# Patient Record
Sex: Female | Born: 1945 | Race: White | Hispanic: No | Marital: Married | Smoking: Former smoker
Health system: Southern US, Community
[De-identification: ages and names within clinical notes are randomized; demographics above are authoritative.]

## PROBLEM LIST (undated history)

## (undated) DIAGNOSIS — F419 Anxiety disorder, unspecified: Secondary | ICD-10-CM

## (undated) DIAGNOSIS — M1990 Unspecified osteoarthritis, unspecified site: Secondary | ICD-10-CM

## (undated) DIAGNOSIS — T7840XA Allergy, unspecified, initial encounter: Secondary | ICD-10-CM

## (undated) DIAGNOSIS — Z780 Asymptomatic menopausal state: Secondary | ICD-10-CM

## (undated) DIAGNOSIS — E785 Hyperlipidemia, unspecified: Secondary | ICD-10-CM

## (undated) DIAGNOSIS — I10 Essential (primary) hypertension: Secondary | ICD-10-CM

## (undated) DIAGNOSIS — G4700 Insomnia, unspecified: Secondary | ICD-10-CM

## (undated) DIAGNOSIS — I8390 Asymptomatic varicose veins of unspecified lower extremity: Secondary | ICD-10-CM

## (undated) DIAGNOSIS — F4310 Post-traumatic stress disorder, unspecified: Secondary | ICD-10-CM

## (undated) DIAGNOSIS — K219 Gastro-esophageal reflux disease without esophagitis: Secondary | ICD-10-CM

## (undated) DIAGNOSIS — M810 Age-related osteoporosis without current pathological fracture: Secondary | ICD-10-CM

## (undated) DIAGNOSIS — N952 Postmenopausal atrophic vaginitis: Secondary | ICD-10-CM

## (undated) DIAGNOSIS — M26629 Arthralgia of temporomandibular joint, unspecified side: Secondary | ICD-10-CM

## (undated) DIAGNOSIS — M8580 Other specified disorders of bone density and structure, unspecified site: Secondary | ICD-10-CM

## (undated) DIAGNOSIS — N393 Stress incontinence (female) (male): Secondary | ICD-10-CM

## (undated) DIAGNOSIS — N939 Abnormal uterine and vaginal bleeding, unspecified: Secondary | ICD-10-CM

## (undated) HISTORY — DX: Post-traumatic stress disorder, unspecified: F43.10

## (undated) HISTORY — DX: Hyperlipidemia, unspecified: E78.5

## (undated) HISTORY — DX: Anxiety disorder, unspecified: F41.9

## (undated) HISTORY — DX: Stress incontinence (female) (male): N39.3

## (undated) HISTORY — DX: Age-related osteoporosis without current pathological fracture: M81.0

## (undated) HISTORY — PX: DILATION AND CURETTAGE OF UTERUS: SHX78

## (undated) HISTORY — DX: Postmenopausal atrophic vaginitis: N95.2

## (undated) HISTORY — DX: Arthralgia of temporomandibular joint, unspecified side: M26.629

## (undated) HISTORY — DX: Asymptomatic menopausal state: Z78.0

## (undated) HISTORY — DX: Other specified disorders of bone density and structure, unspecified site: M85.80

## (undated) HISTORY — PX: OTHER SURGICAL HISTORY: SHX169

## (undated) HISTORY — DX: Essential (primary) hypertension: I10

## (undated) HISTORY — DX: Gastro-esophageal reflux disease without esophagitis: K21.9

## (undated) HISTORY — DX: Allergy, unspecified, initial encounter: T78.40XA

## (undated) HISTORY — DX: Insomnia, unspecified: G47.00

---

## 2001-03-05 ENCOUNTER — Encounter: Admission: RE | Admit: 2001-03-05 | Discharge: 2001-03-05 | Payer: Self-pay | Admitting: Orthopedic Surgery

## 2001-03-05 ENCOUNTER — Encounter: Payer: Self-pay | Admitting: Orthopedic Surgery

## 2013-01-11 ENCOUNTER — Ambulatory Visit: Payer: Self-pay | Admitting: Otolaryngology

## 2014-03-07 ENCOUNTER — Ambulatory Visit: Payer: Self-pay | Admitting: Podiatry

## 2014-04-11 ENCOUNTER — Ambulatory Visit: Payer: Self-pay | Admitting: Podiatry

## 2014-05-25 ENCOUNTER — Encounter: Payer: Self-pay | Admitting: Internal Medicine

## 2015-04-20 ENCOUNTER — Encounter: Payer: Self-pay | Admitting: Internal Medicine

## 2015-04-27 ENCOUNTER — Telehealth: Payer: Self-pay | Admitting: Cardiovascular Disease

## 2015-04-27 NOTE — Telephone Encounter (Signed)
Did not need this encounter °

## 2015-06-12 ENCOUNTER — Encounter: Payer: Self-pay | Admitting: Family Medicine

## 2015-07-10 DIAGNOSIS — M810 Age-related osteoporosis without current pathological fracture: Secondary | ICD-10-CM

## 2015-07-10 DIAGNOSIS — F419 Anxiety disorder, unspecified: Secondary | ICD-10-CM | POA: Insufficient documentation

## 2015-07-10 DIAGNOSIS — M26629 Arthralgia of temporomandibular joint, unspecified side: Secondary | ICD-10-CM | POA: Insufficient documentation

## 2015-07-10 DIAGNOSIS — I10 Essential (primary) hypertension: Secondary | ICD-10-CM

## 2015-07-10 DIAGNOSIS — J309 Allergic rhinitis, unspecified: Secondary | ICD-10-CM | POA: Insufficient documentation

## 2015-07-10 DIAGNOSIS — E785 Hyperlipidemia, unspecified: Secondary | ICD-10-CM | POA: Insufficient documentation

## 2015-07-10 DIAGNOSIS — K219 Gastro-esophageal reflux disease without esophagitis: Secondary | ICD-10-CM | POA: Insufficient documentation

## 2015-07-10 DIAGNOSIS — M8580 Other specified disorders of bone density and structure, unspecified site: Secondary | ICD-10-CM

## 2015-07-10 DIAGNOSIS — Z780 Asymptomatic menopausal state: Secondary | ICD-10-CM | POA: Insufficient documentation

## 2015-07-10 DIAGNOSIS — N393 Stress incontinence (female) (male): Secondary | ICD-10-CM

## 2015-07-10 DIAGNOSIS — G4700 Insomnia, unspecified: Secondary | ICD-10-CM | POA: Insufficient documentation

## 2015-07-10 DIAGNOSIS — N952 Postmenopausal atrophic vaginitis: Secondary | ICD-10-CM

## 2015-07-17 ENCOUNTER — Ambulatory Visit (INDEPENDENT_AMBULATORY_CARE_PROVIDER_SITE_OTHER): Payer: Medicare Other | Admitting: Unknown Physician Specialty

## 2015-07-17 ENCOUNTER — Encounter: Payer: Self-pay | Admitting: Unknown Physician Specialty

## 2015-07-17 VITALS — BP 118/70 | HR 63 | Temp 98.3°F | Ht 66.0 in | Wt 149.8 lb

## 2015-07-17 DIAGNOSIS — F419 Anxiety disorder, unspecified: Secondary | ICD-10-CM | POA: Diagnosis not present

## 2015-07-17 DIAGNOSIS — F4312 Post-traumatic stress disorder, chronic: Secondary | ICD-10-CM | POA: Insufficient documentation

## 2015-07-17 MED ORDER — ALPRAZOLAM 0.5 MG PO TABS
0.5000 mg | ORAL_TABLET | Freq: Every evening | ORAL | Status: DC | PRN
Start: 2015-07-17 — End: 2016-01-29

## 2015-07-17 NOTE — Assessment & Plan Note (Signed)
Pt is maintained on present dose of alprazolam.  Rx given for 6 months

## 2015-07-17 NOTE — Progress Notes (Signed)
   BP 118/70 mmHg  Pulse 63  Temp(Src) 98.3 F (36.8 C)  Ht 5\' 6"  (1.676 m)  Wt 149 lb 12.8 oz (67.949 kg)  BMI 24.19 kg/m2  SpO2 100%  LMP  (LMP Unknown)   Subjective:    Patient ID: Hailey Wall, female    DOB: 1946/02/09, 69 y.o.   MRN: [id number]  HPI: Hailey Wall is a 69 y.o. female  Chief Complaint  Patient presents with  . Medication Refill   "I am here for my yearly Alprazolam" review.  Pt states she takes a 1/2 at night before she goes to bed.  States she is trying to take less at night as is cutting off some corners.  She is having procedures done for her leg veins and "it tears my nerves up."  States it helps her rest and get about 6-7 hours of sleep.    Relevant past medical, surgical, family and social history reviewed and updated as indicated. Interim medical history since our last visit reviewed. Allergies and medications reviewed and updated.  Review of Systems  Constitutional: Negative.   HENT: Negative.   Eyes: Negative.   Respiratory: Negative.   Cardiovascular: Negative.   Gastrointestinal: Negative.   Endocrine: Negative.   Genitourinary: Negative.        Taking Vagifem  Musculoskeletal: Negative.   Skin: Negative.   Allergic/Immunologic:       Getting chiropractic care which is helping allergies.    Neurological: Negative.   Hematological: Negative.   Psychiatric/Behavioral: Negative.     Per HPI unless specifically indicated above     Objective:    BP 118/70 mmHg  Pulse 63  Temp(Src) 98.3 F (36.8 C)  Ht 5\' 6"  (1.676 m)  Wt 149 lb 12.8 oz (67.949 kg)  BMI 24.19 kg/m2  SpO2 100%  LMP  (LMP Unknown)  Wt Readings from Last 3 Encounters:  07/17/15 149 lb 12.8 oz (67.949 kg)  03/20/15 151 lb (68.493 kg)    Physical Exam  Constitutional: She is oriented to person, place, and time. She appears well-developed and well-nourished. No distress.  HENT:  Head: Normocephalic and atraumatic.  Eyes: Conjunctivae and lids are normal. Right  eye exhibits no discharge. Left eye exhibits no discharge. No scleral icterus.  Cardiovascular: Normal rate, regular rhythm and normal heart sounds.   Pulmonary/Chest: Effort normal and breath sounds normal. No respiratory distress.  Abdominal: Normal appearance. There is no splenomegaly or hepatomegaly.  Musculoskeletal: Normal range of motion.  Neurological: She is alert and oriented to person, place, and time.  Skin: Skin is intact.  Psychiatric: She has a normal mood and affect. Her behavior is normal. Judgment and thought content normal.    No results found for this or any previous visit.    Assessment & Plan:   Problem List Items Addressed This Visit      Other   Chronic anxiety - Primary    Pt is maintained on present dose of alprazolam.  Rx given for 6 months      Relevant Medications   ALPRAZolam (XANAX) 0.5 MG tablet       Follow up plan: Return for Needs recheck for PE.

## 2015-11-08 ENCOUNTER — Other Ambulatory Visit: Payer: Self-pay

## 2015-11-08 MED ORDER — ESTRADIOL 10 MCG VA TABS: 10.0000 ug | ORAL_TABLET | VAGINAL | Status: DC

## 2015-11-08 NOTE — Telephone Encounter (Signed)
We got a refill request for vagifem vaginal tablets but according to practice partner, they were last prescribed in August of 2015. The rx request states that the rx was last filled on 10/09/15. Patient was last seen with [place] 07/17/15 and practice partner number [place] [id number]. Does she need to be seen to refill this?

## 2015-11-13 ENCOUNTER — Ambulatory Visit: Payer: Medicare Other

## 2015-11-13 ENCOUNTER — Encounter: Payer: Medicare Other | Admitting: Unknown Physician Specialty

## 2015-11-13 DIAGNOSIS — Z23 Encounter for immunization: Secondary | ICD-10-CM

## 2016-01-02 ENCOUNTER — Encounter: Payer: Medicare Other | Admitting: Unknown Physician Specialty

## 2016-01-08 ENCOUNTER — Encounter: Payer: Medicare Other | Admitting: Unknown Physician Specialty

## 2016-01-10 DIAGNOSIS — J301 Allergic rhinitis due to pollen: Secondary | ICD-10-CM | POA: Diagnosis not present

## 2016-01-24 DIAGNOSIS — J301 Allergic rhinitis due to pollen: Secondary | ICD-10-CM | POA: Diagnosis not present

## 2016-01-29 ENCOUNTER — Other Ambulatory Visit: Payer: Self-pay | Admitting: Unknown Physician Specialty

## 2016-01-29 ENCOUNTER — Encounter: Payer: Self-pay | Admitting: Unknown Physician Specialty

## 2016-01-29 ENCOUNTER — Ambulatory Visit (INDEPENDENT_AMBULATORY_CARE_PROVIDER_SITE_OTHER): Payer: PPO | Admitting: Unknown Physician Specialty

## 2016-01-29 VITALS — BP 134/76 | HR 71 | Temp 98.1°F | Ht 65.7 in | Wt 149.2 lb

## 2016-01-29 DIAGNOSIS — F419 Anxiety disorder, unspecified: Secondary | ICD-10-CM | POA: Diagnosis not present

## 2016-01-29 DIAGNOSIS — J0140 Acute pansinusitis, unspecified: Secondary | ICD-10-CM | POA: Diagnosis not present

## 2016-01-29 MED ORDER — ALPRAZOLAM 0.5 MG PO TABS: 0.5000 mg | ORAL_TABLET | Freq: Every evening | ORAL | Status: DC | PRN

## 2016-01-29 MED ORDER — LEVOFLOXACIN 500 MG PO TABS: 500.0000 mg | ORAL_TABLET | Freq: Every day | ORAL | Status: DC

## 2016-01-29 NOTE — Progress Notes (Signed)
BP 134/76 mmHg  Pulse 71  Temp(Src) 98.1 F (36.7 C)  Ht 5' 5.7" (1.669 m)  Wt 149 lb 3.2 oz (67.677 kg)  BMI 24.30 kg/m2  SpO2 97%  LMP  (LMP Unknown)   Subjective:    Patient ID: Hailey Wall, female    DOB: [birth year], 70 y.o.   MRN: [id number]  HPI: Hailey Wall is a 69 y.o. female  Chief Complaint  Patient presents with  . Sinusitis    pt states she is having sinus pressure, headache, drainage, and some cough (productive). States symptoms started to get worse a few weeks ago.   Sinusitis This is a recurrent (Recurrent problem and sees Dr. Pryor Ochoa.  she has allergies and gets monthly shots.  ) problem. The problem has been rapidly worsening since onset. There has been no fever. Associated symptoms include congestion, headaches, sinus pressure, a sore throat and swollen glands. Pertinent negatives include no chills, coughing, diaphoresis, ear pain, hoarse voice, neck pain, shortness of breath or sneezing. Past treatments include nothing.   Anxiety/insonmnia Takes a 1/2 pill at night to "hlep me rest."  She would like a refill.  Has cut back from 1 to 1/2  Relevant past medical, surgical, family and social history reviewed and updated as indicated. Interim medical history since our last visit reviewed. Allergies and medications reviewed and updated.  Review of Systems  Constitutional: Negative for chills and diaphoresis.  HENT: Positive for congestion, sinus pressure and sore throat. Negative for ear pain, hoarse voice and sneezing.   Respiratory: Negative for cough and shortness of breath.   Musculoskeletal: Negative for neck pain.  Neurological: Positive for headaches.    Per HPI unless specifically indicated above     Objective:    BP 134/76 mmHg  Pulse 71  Temp(Src) 98.1 F (36.7 C)  Ht 5' 5.7" (1.669 m)  Wt 149 lb 3.2 oz (67.677 kg)  BMI 24.30 kg/m2  SpO2 97%  LMP  (LMP Unknown)  Wt Readings from Last 3 Encounters:  01/29/16 149 lb 3.2 oz (67.677 kg)   07/17/15 149 lb 12.8 oz (67.949 kg)  03/20/15 151 lb (68.493 kg)    Physical Exam  Constitutional: She is oriented to person, place, and time. She appears well-developed and well-nourished. No distress.  HENT:  Head: Normocephalic and atraumatic.  Right Ear: Tympanic membrane and ear canal normal.  Left Ear: Tympanic membrane and ear canal normal.  Nose: No rhinorrhea. Right sinus exhibits maxillary sinus tenderness and frontal sinus tenderness. Left sinus exhibits maxillary sinus tenderness and frontal sinus tenderness.  Eyes: Conjunctivae and lids are normal. Right eye exhibits no discharge. Left eye exhibits no discharge. No scleral icterus.  Cardiovascular: Normal rate and regular rhythm.   Pulmonary/Chest: Effort normal and breath sounds normal. No respiratory distress.  Abdominal: Normal appearance. There is no splenomegaly or hepatomegaly.  Musculoskeletal: Normal range of motion.  Neurological: She is alert and oriented to person, place, and time.  Skin: Skin is intact. No rash noted. No pallor.  Psychiatric: She has a normal mood and affect. Her behavior is normal. Judgment and thought content normal.    No results found for this or any previous visit.    Assessment & Plan:   Problem List Items Addressed This Visit      Unprioritized   Chronic anxiety    Continue Xanax at 1/2 tab/day and continue taper as possible      Relevant Medications   ALPRAZolam (XANAX) 0.5 MG tablet  Other Visit Diagnoses    Acute pansinusitis, recurrence not specified    -  Primary    Relevant Medications    levofloxacin (LEVAQUIN) 500 MG tablet        Follow up plan: Return for for physical.

## 2016-01-29 NOTE — Assessment & Plan Note (Signed)
Continue Xanax at 1/2 tab/day and continue taper as possible

## 2016-02-07 DIAGNOSIS — J301 Allergic rhinitis due to pollen: Secondary | ICD-10-CM | POA: Diagnosis not present

## 2016-02-12 DIAGNOSIS — H6980 Other specified disorders of Eustachian tube, unspecified ear: Secondary | ICD-10-CM | POA: Diagnosis not present

## 2016-02-12 DIAGNOSIS — J340 Abscess, furuncle and carbuncle of nose: Secondary | ICD-10-CM | POA: Diagnosis not present

## 2016-02-12 DIAGNOSIS — R05 Cough: Secondary | ICD-10-CM | POA: Diagnosis not present

## 2016-02-12 DIAGNOSIS — H7202 Central perforation of tympanic membrane, left ear: Secondary | ICD-10-CM | POA: Diagnosis not present

## 2016-02-12 DIAGNOSIS — H6123 Impacted cerumen, bilateral: Secondary | ICD-10-CM | POA: Diagnosis not present

## 2016-02-14 DIAGNOSIS — J301 Allergic rhinitis due to pollen: Secondary | ICD-10-CM | POA: Diagnosis not present

## 2016-02-19 ENCOUNTER — Encounter: Payer: Self-pay | Admitting: Unknown Physician Specialty

## 2016-02-19 ENCOUNTER — Ambulatory Visit (INDEPENDENT_AMBULATORY_CARE_PROVIDER_SITE_OTHER): Payer: PPO | Admitting: Unknown Physician Specialty

## 2016-02-19 VITALS — BP 128/75 | HR 79 | Temp 98.1°F | Ht 65.7 in | Wt 148.6 lb

## 2016-02-19 DIAGNOSIS — N3001 Acute cystitis with hematuria: Secondary | ICD-10-CM

## 2016-02-19 DIAGNOSIS — R319 Hematuria, unspecified: Secondary | ICD-10-CM | POA: Diagnosis not present

## 2016-02-19 MED ORDER — CIPROFLOXACIN HCL 250 MG PO TABS: 250.0000 mg | ORAL_TABLET | Freq: Two times a day (BID) | ORAL | Status: DC

## 2016-02-19 NOTE — Progress Notes (Signed)
   BP 128/75 mmHg  Pulse 79  Temp(Src) 98.1 F (36.7 C)  Ht 5' 5.7" (1.669 m)  Wt 148 lb 9.6 oz (67.405 kg)  BMI 24.20 kg/m2  SpO2 100%  LMP  (LMP Unknown)   Subjective:    Patient ID: Hailey Wall, female    DOB: 1946-07-07, 70 y.o.   MRN: [id number]  HPI: Hailey Wall is a 70 y.o. female  Chief Complaint  Patient presents with  . Urinary Tract Infection    pt states she has had some spotting when she wipes, but says she does not have any other symptoms   Urinary Tract Infection  This is a new problem. The current episode started yesterday. The problem occurs every urination. The problem has been rapidly worsening. The quality of the pain is described as burning. The pain is severe. Associated symptoms include hematuria. Pertinent negatives include no chills, discharge, flank pain, frequency, hesitancy, nausea, possible pregnancy, sweats, urgency or vomiting. She has tried nothing for the symptoms.     Relevant past medical, surgical, family and social history reviewed and updated as indicated. Interim medical history since our last visit reviewed. Allergies and medications reviewed and updated.  Review of Systems  Constitutional: Negative for chills.  Gastrointestinal: Negative for nausea and vomiting.  Genitourinary: Positive for hematuria. Negative for hesitancy, urgency, frequency and flank pain.    Per HPI unless specifically indicated above     Objective:    BP 128/75 mmHg  Pulse 79  Temp(Src) 98.1 F (36.7 C)  Ht 5' 5.7" (1.669 m)  Wt 148 lb 9.6 oz (67.405 kg)  BMI 24.20 kg/m2  SpO2 100%  LMP  (LMP Unknown)  Wt Readings from Last 3 Encounters:  02/19/16 148 lb 9.6 oz (67.405 kg)  01/29/16 149 lb 3.2 oz (67.677 kg)  07/17/15 149 lb 12.8 oz (67.949 kg)    Physical Exam  Constitutional: She is oriented to person, place, and time. She appears well-developed and well-nourished. No distress.  HENT:  Head: Normocephalic and atraumatic.  Eyes:  Conjunctivae and lids are normal. Right eye exhibits no discharge. Left eye exhibits no discharge. No scleral icterus.  Cardiovascular: Normal rate.   Pulmonary/Chest: Effort normal.  Abdominal: Normal appearance. There is no splenomegaly or hepatomegaly.  Musculoskeletal: Normal range of motion.  Neurological: She is alert and oriented to person, place, and time.  Skin: Skin is intact. No rash noted. No pallor.  Psychiatric: She has a normal mood and affect. Her behavior is normal. Judgment and thought content normal.    No results found for this or any previous visit.    Assessment & Plan:   Problem List Items Addressed This Visit    None    Visit Diagnoses    Blood in urine    -  Primary    Relevant Orders    UA/M w/rflx Culture, Routine    Acute cystitis with hematuria        Rx for Cipro BID for 7 days        Follow up plan: Return if symptoms worsen or fail to improve.

## 2016-02-20 ENCOUNTER — Encounter (HOSPITAL_COMMUNITY): Payer: Self-pay | Admitting: Emergency Medicine

## 2016-02-20 ENCOUNTER — Emergency Department (HOSPITAL_COMMUNITY)
Admission: EM | Admit: 2016-02-20 | Discharge: 2016-02-20 | Disposition: A | Discharge status: Home / Self Care | Payer: PPO | Attending: Emergency Medicine | Admitting: Emergency Medicine

## 2016-02-20 DIAGNOSIS — I10 Essential (primary) hypertension: Secondary | ICD-10-CM | POA: Diagnosis not present

## 2016-02-20 DIAGNOSIS — G4700 Insomnia, unspecified: Secondary | ICD-10-CM | POA: Insufficient documentation

## 2016-02-20 DIAGNOSIS — Z87891 Personal history of nicotine dependence: Secondary | ICD-10-CM | POA: Diagnosis not present

## 2016-02-20 DIAGNOSIS — K219 Gastro-esophageal reflux disease without esophagitis: Secondary | ICD-10-CM | POA: Insufficient documentation

## 2016-02-20 DIAGNOSIS — R319 Hematuria, unspecified: Secondary | ICD-10-CM | POA: Diagnosis not present

## 2016-02-20 DIAGNOSIS — Z8669 Personal history of other diseases of the nervous system and sense organs: Secondary | ICD-10-CM | POA: Insufficient documentation

## 2016-02-20 DIAGNOSIS — Z792 Long term (current) use of antibiotics: Secondary | ICD-10-CM | POA: Insufficient documentation

## 2016-02-20 DIAGNOSIS — N950 Postmenopausal bleeding: Secondary | ICD-10-CM | POA: Diagnosis not present

## 2016-02-20 DIAGNOSIS — Z79899 Other long term (current) drug therapy: Secondary | ICD-10-CM | POA: Diagnosis not present

## 2016-02-20 DIAGNOSIS — Z7951 Long term (current) use of inhaled steroids: Secondary | ICD-10-CM | POA: Diagnosis not present

## 2016-02-20 DIAGNOSIS — E785 Hyperlipidemia, unspecified: Secondary | ICD-10-CM | POA: Insufficient documentation

## 2016-02-20 DIAGNOSIS — F419 Anxiety disorder, unspecified: Secondary | ICD-10-CM | POA: Diagnosis not present

## 2016-02-20 LAB — URINALYSIS, ROUTINE W REFLEX MICROSCOPIC
Bilirubin Urine: NEGATIVE
Glucose, UA: NEGATIVE mg/dL
KETONES UR: 15 mg/dL — AB
LEUKOCYTES UA: NEGATIVE
NITRITE: NEGATIVE
PROTEIN: NEGATIVE mg/dL
Specific Gravity, Urine: 1.005 (ref 1.005–1.030)
pH: 6 (ref 5.0–8.0)

## 2016-02-20 LAB — URINE MICROSCOPIC-ADD ON

## 2016-02-20 NOTE — ED Notes (Signed)
Pt is in stable condition upon d/c and ambulates from ED. 

## 2016-02-20 NOTE — ED Provider Notes (Signed)
CSN: [id number]     Arrival date & time 02/20/16  [id number] History   First MD Initiated Contact with Patient 02/20/16 [phone]     Chief Complaint  Patient presents with  . Hematuria     (Consider location/radiation/quality/duration/timing/severity/associated sxs/prior Treatment) HPI Comments: Bladder prolapse Took it out/pessary Saturday Noticed a small amount of blood on pessary Sometimes has urgency to urinate, sometimes don't Started noticing spots of blood, initially thought it was vaginal Called office and couldn't bee seen until yesterday at 1, PCP Blood in urine when urination Cramping lower uterine She checked urine and said there was infection No dysuria Cramping pain since yesterday today Not sure if urine or vaginal source    Patient is a 70 y.o. female presenting with hematuria.  Hematuria Pertinent negatives include no chest pain, no abdominal pain, no headaches and no shortness of breath.    Past Medical History  Diagnosis Date  . TMJ arthralgia   . Allergy   . Osteoporosis   . Anxiety   . Menopause   . Atrophic vaginitis   . Stress incontinence   . Insomnia   . Osteopenia   . GERD (gastroesophageal reflux disease)   . Hyperlipidemia   . Hypertension    Past Surgical History  Procedure Laterality Date  . Dilation and curettage of uterus    . Two bunions removed     Family History  Problem Relation Age of Onset  . Heart disease Mother     CHF  . Mental illness Mother   . COPD Mother   . Emphysema Mother   . Scoliosis Daughter    Social History  Substance Use Topics  . Smoking status: Former Research scientist (life sciences)  . Smokeless tobacco: Never Used  . Alcohol Use: No   OB History    No data available     Review of Systems  Constitutional: Negative for fever.  HENT: Negative for sore throat.   Eyes: Negative for visual disturbance.  Respiratory: Negative for cough and shortness of breath.   Cardiovascular: Negative for chest pain.  Gastrointestinal:  Negative for abdominal pain.  Genitourinary: Positive for hematuria and vaginal bleeding (possible, not sure if vaginal or urinal). Negative for difficulty urinating.  Musculoskeletal: Negative for back pain and neck pain.  Skin: Negative for rash.  Neurological: Negative for syncope and headaches.      Allergies  Cat hair extract and Lodine  Home Medications   Prior to Admission medications   Medication Sig Start Date End Date Taking? Authorizing Provider  ALPRAZolam Duanne Moron) 0.5 MG tablet Take 1 tablet (0.5 mg total) by mouth at bedtime as needed for anxiety. 01/29/16  Yes Kathrine Haddock, NP  Calcium Citrate-Vitamin D (CALCIUM + D PO) Take 1 tablet by mouth daily.   Yes Historical Provider, MD  ciprofloxacin (CIPRO) 250 MG tablet Take 1 tablet (250 mg total) by mouth 2 (two) times daily. 02/19/16  Yes Kathrine Haddock, NP  CRANBERRY PO Take 800 mg by mouth daily.   Yes Historical Provider, MD  cyanocobalamin 100 MCG tablet Take 100 mcg by mouth daily.   Yes Historical Provider, MD  Estradiol (VAGIFEM) 10 MCG TABS vaginal tablet Place 1 tablet (10 mcg total) vaginally 2 (two) times a week. 11/08/15  Yes Kathrine Haddock, NP  fluticasone (FLOVENT HFA) 44 MCG/ACT inhaler Inhale 2 puffs into the lungs 2 (two) times daily.   Yes Historical Provider, MD  magnesium oxide (MAG-OX) 400 MG tablet Take 400 mg by mouth daily.   Yes  Historical Provider, MD  Multiple Vitamin (MULTIVITAMIN) tablet Take 1 tablet by mouth daily.   Yes Historical Provider, MD  Omega 3-6-9 Fatty Acids (OMEGA 3-6-9 COMPLEX PO) Take 1 tablet by mouth daily.   Yes Historical Provider, MD  triamcinolone (NASACORT) 55 MCG/ACT AERO nasal inhaler Place 2 sprays into the nose daily.   Yes Historical Provider, MD  TURMERIC PO Take 1,000 mg by mouth daily.   Yes Historical Provider, MD  levofloxacin (LEVAQUIN) 500 MG tablet Take 1 tablet (500 mg total) by mouth daily. 01/29/16   Kathrine Haddock, NP   BP 116/90 mmHg  Pulse 82  Temp(Src) 98.5  F (36.9 C) (Oral)  Resp 20  SpO2 98%  LMP  (LMP Unknown) Physical Exam  Constitutional: She is oriented to person, place, and time. She appears well-developed and well-nourished. No distress.  HENT:  Head: Normocephalic and atraumatic.  Eyes: Conjunctivae and EOM are normal.  Neck: Normal range of motion.  Cardiovascular: Normal rate, regular rhythm, normal heart sounds and intact distal pulses.  Exam reveals no gallop and no friction rub.   No murmur heard. Pulmonary/Chest: Effort normal and breath sounds normal. No respiratory distress. She has no wheezes. She has no rales.  Abdominal: Soft. She exhibits no distension. There is no tenderness. There is no guarding.  Genitourinary: Uterus is not deviated. Cervix exhibits no motion tenderness and no discharge. Right adnexum displays no tenderness. There is bleeding in the vagina. No foreign body around the vagina.  Musculoskeletal: She exhibits no edema or tenderness.  Neurological: She is alert and oriented to person, place, and time.  Skin: Skin is warm and dry. No rash noted. She is not diaphoretic. No erythema.  Nursing note and vitals reviewed.   ED Course  Procedures (including critical care time) Labs Review Labs Reviewed  URINALYSIS, ROUTINE W REFLEX MICROSCOPIC (NOT AT [facility]) - Abnormal; Notable for the following:    Hgb urine dipstick LARGE (*)    Ketones, ur 15 (*)    All other components within normal limits  URINE MICROSCOPIC-ADD ON - Abnormal; Notable for the following:    Squamous Epithelial / LPF 0-5 (*)    Bacteria, UA FEW (*)    All other components within normal limits    Imaging Review No results found. I have personally reviewed and evaluated these images and lab results as part of my medical decision-making.   EKG Interpretation None      MDM   Final diagnoses:  Postmenopausal vaginal bleeding   70yo female with history of anxiety, hypertension, hyperlipidemia, bladder prolapse, presents with  concern for vaginal bleeding vs hematuria.  Urine showed question of infection at PCP office yesterday, however reviewed results showing 0-5wbc, few bacteria. Repeated urinalysis here showing hematuria however no clear infection. Pelvic exam shows vaginal bleeding likely as etiology of symptoms. No significant hemorrhage, no hemodynamic instability. This is likely etiology of lower abdominal cramping.  Abd exam benign, doubt other etiology of symptoms. Recommend OBGYN follow up for postmenopausal vaginal bleeding. Patient discharged in stable condition with understanding of reasons to return.    Gareth Morgan, MD 02/20/16 2237

## 2016-02-20 NOTE — Discharge Instructions (Signed)

## 2016-02-20 NOTE — ED Notes (Signed)
Called 1x

## 2016-02-20 NOTE — ED Notes (Signed)
Pt reports spots of blood in urine beginning Saturday.  Pt reports increasing bleeding, was seen yesterday by PCP.  Pt reports wearing a "pressor" for bladder prolapse.

## 2016-02-21 DIAGNOSIS — N950 Postmenopausal bleeding: Secondary | ICD-10-CM | POA: Diagnosis not present

## 2016-02-21 DIAGNOSIS — J301 Allergic rhinitis due to pollen: Secondary | ICD-10-CM | POA: Diagnosis not present

## 2016-02-21 LAB — UA/M W/RFLX CULTURE, ROUTINE
BILIRUBIN UA: NEGATIVE
Glucose, UA: NEGATIVE
Ketones, UA: NEGATIVE
LEUKOCYTES UA: NEGATIVE
Nitrite, UA: POSITIVE — AB
PH UA: 6 (ref 5.0–7.5)
Protein, UA: NEGATIVE
Specific Gravity, UA: <1.005 — ABNORMAL LOW (ref 1.005–1.030)
Urobilinogen, Ur: 0.2 mg/dL (ref 0.2–1.0)

## 2016-02-21 LAB — URINE CULTURE, REFLEX: Organism ID, Bacteria: NO GROWTH

## 2016-02-21 LAB — MICROSCOPIC EXAMINATION

## 2016-02-26 ENCOUNTER — Encounter: Payer: Medicare Other | Admitting: Unknown Physician Specialty

## 2016-02-26 DIAGNOSIS — N950 Postmenopausal bleeding: Secondary | ICD-10-CM | POA: Diagnosis not present

## 2016-02-26 DIAGNOSIS — J301 Allergic rhinitis due to pollen: Secondary | ICD-10-CM | POA: Diagnosis not present

## 2016-02-26 DIAGNOSIS — N840 Polyp of corpus uteri: Secondary | ICD-10-CM | POA: Diagnosis not present

## 2016-02-26 DIAGNOSIS — N8501 Benign endometrial hyperplasia: Secondary | ICD-10-CM | POA: Diagnosis not present

## 2016-02-27 ENCOUNTER — Telehealth: Payer: Self-pay | Admitting: Unknown Physician Specialty

## 2016-02-27 ENCOUNTER — Ambulatory Visit: Payer: PPO | Admitting: Unknown Physician Specialty

## 2016-02-27 NOTE — Telephone Encounter (Signed)
Pt called requests call back from [place] to discuss things she has going on and why she is not able to come in for her appt today. Please call ASAP. Thanks.

## 2016-02-27 NOTE — Telephone Encounter (Signed)
Routing to Cheryl

## 2016-02-27 NOTE — Telephone Encounter (Signed)
Discussed with patient about post-menopausal bleeding.  She had an evaluation by gyn and set up for a hysterectomy tomorrow.  She states she is very stressed.

## 2016-02-28 DIAGNOSIS — J301 Allergic rhinitis due to pollen: Secondary | ICD-10-CM | POA: Diagnosis not present

## 2016-03-04 DIAGNOSIS — N950 Postmenopausal bleeding: Secondary | ICD-10-CM | POA: Diagnosis not present

## 2016-03-04 DIAGNOSIS — N8111 Cystocele, midline: Secondary | ICD-10-CM | POA: Diagnosis not present

## 2016-03-04 DIAGNOSIS — N8501 Benign endometrial hyperplasia: Secondary | ICD-10-CM | POA: Diagnosis not present

## 2016-03-04 DIAGNOSIS — N814 Uterovaginal prolapse, unspecified: Secondary | ICD-10-CM | POA: Diagnosis not present

## 2016-03-06 DIAGNOSIS — J301 Allergic rhinitis due to pollen: Secondary | ICD-10-CM | POA: Diagnosis not present

## 2016-03-13 DIAGNOSIS — J301 Allergic rhinitis due to pollen: Secondary | ICD-10-CM | POA: Diagnosis not present

## 2016-03-20 ENCOUNTER — Encounter
Admission: RE | Admit: 2016-03-20 | Discharge: 2016-03-20 | Disposition: A | Discharge status: Home / Self Care | Payer: PPO | Source: Ambulatory Visit | Attending: Obstetrics & Gynecology | Admitting: Obstetrics & Gynecology

## 2016-03-20 DIAGNOSIS — J301 Allergic rhinitis due to pollen: Secondary | ICD-10-CM | POA: Diagnosis not present

## 2016-03-20 DIAGNOSIS — N8111 Cystocele, midline: Secondary | ICD-10-CM | POA: Diagnosis not present

## 2016-03-20 DIAGNOSIS — N950 Postmenopausal bleeding: Secondary | ICD-10-CM | POA: Diagnosis not present

## 2016-03-20 DIAGNOSIS — I10 Essential (primary) hypertension: Secondary | ICD-10-CM | POA: Diagnosis not present

## 2016-03-20 DIAGNOSIS — N8501 Benign endometrial hyperplasia: Secondary | ICD-10-CM | POA: Insufficient documentation

## 2016-03-20 DIAGNOSIS — Z01812 Encounter for preprocedural laboratory examination: Secondary | ICD-10-CM | POA: Insufficient documentation

## 2016-03-20 DIAGNOSIS — Z01810 Encounter for preprocedural cardiovascular examination: Secondary | ICD-10-CM | POA: Diagnosis not present

## 2016-03-20 HISTORY — DX: Asymptomatic varicose veins of unspecified lower extremity: I83.90

## 2016-03-20 HISTORY — DX: Abnormal uterine and vaginal bleeding, unspecified: N93.9

## 2016-03-20 LAB — CBC
HCT: 37.5 % (ref 35.0–47.0)
HEMOGLOBIN: 12.4 g/dL (ref 12.0–16.0)
MCH: 29.9 pg (ref 26.0–34.0)
MCHC: 33.1 g/dL (ref 32.0–36.0)
MCV: 90.4 fL (ref 80.0–100.0)
PLATELETS: 179 10*3/uL (ref 150–440)
RBC: 4.15 MIL/uL (ref 3.80–5.20)
RDW: 13.2 % (ref 11.5–14.5)
WBC: 6.1 10*3/uL (ref 3.6–11.0)

## 2016-03-20 LAB — TYPE AND SCREEN
ABO/RH(D): O POS
ANTIBODY SCREEN: NEGATIVE

## 2016-03-20 LAB — PROTIME-INR
INR: 1.06
PROTHROMBIN TIME: 14 s (ref 11.4–15.0)

## 2016-03-20 LAB — APTT: APTT: 35 s (ref 24–36)

## 2016-03-20 NOTE — Patient Instructions (Addendum)
  Your procedure is scheduled on: 04/03/16 Thurs Report to Day Surgery.2nd floor medical mall To find out your arrival time please call [phone] between 1PM - 3PM on 04/02/16 Wed.  Remember: Instructions that are not followed completely may result in serious medical risk, up to and including death, or upon the discretion of your surgeon and anesthesiologist your surgery may need to be rescheduled.    _x__ 1. Do not eat food or drink liquids after midnight. No gum chewing or hard candies.     ____ 2. No Alcohol for 24 hours before or after surgery.   ____ 3. Bring all medications with you on the day of surgery if instructed.    __x__ 4. Notify your doctor if there is any change in your medical condition     (cold, fever, infections).     Do not wear jewelry, make-up, hairpins, clips or nail polish.  Do not wear lotions, powders, or perfumes. You may wear deodorant.  Do not shave 48 hours prior to surgery. Men may shave face and neck.  Do not bring valuables to the hospital.    [facility] is not responsible for any belongings or valuables.               Contacts, dentures or bridgework may not be worn into surgery.  Leave your suitcase in the car. After surgery it may be brought to your room.  For patients admitted to the hospital, discharge time is determined by your                treatment team.   Patients discharged the day of surgery will not be allowed to drive home.   Please read over the following fact sheets that you were given:      _x___ Take these medicines the morning of surgery with A SIP OF WATER:    1.dexlansoprazole (DEXILANT) 60 MG capsule  2.   3.   4.  5.  6.  ____ Fleet Enema (as directed)   __x__ Use CHG Soap as directed  _x___ Use inhalers on the day of surgery use Flovent  ____ Stop metformin 2 days prior to surgery    ____ Take 1/2 of usual insulin dose the night before surgery and none on the morning of surgery.   ____ Stop  Coumadin/Plavix/aspirin on   _x__ Stop Anti-inflammatories on may take Tylenol as needed   _x___ Stop supplements until after surgery.  Stop omega 1 week before surgery  ____ Bring C-Pap to the hospital.

## 2016-03-21 LAB — ABO/RH: ABO/RH(D): O POS

## 2016-03-24 ENCOUNTER — Telehealth: Payer: Self-pay | Admitting: Unknown Physician Specialty

## 2016-03-24 NOTE — Telephone Encounter (Signed)
Patient called regarding an automated called she had and has questions about it. Please return patients call.

## 2016-03-27 DIAGNOSIS — J301 Allergic rhinitis due to pollen: Secondary | ICD-10-CM | POA: Diagnosis not present

## 2016-04-03 ENCOUNTER — Encounter: Payer: Self-pay | Admitting: *Deleted

## 2016-04-03 ENCOUNTER — Encounter
Admission: RE | Disposition: A | Discharge status: Home / Self Care | Payer: Self-pay | Source: Ambulatory Visit | Attending: Obstetrics & Gynecology

## 2016-04-03 ENCOUNTER — Ambulatory Visit: Payer: PPO | Admitting: Certified Registered"

## 2016-04-03 ENCOUNTER — Ambulatory Visit
Admission: RE | Admit: 2016-04-03 | Discharge: 2016-04-03 | Disposition: A | Discharge status: Home / Self Care | Payer: PPO | Source: Ambulatory Visit | Attending: Obstetrics & Gynecology | Admitting: Obstetrics & Gynecology

## 2016-04-03 DIAGNOSIS — D259 Leiomyoma of uterus, unspecified: Secondary | ICD-10-CM | POA: Diagnosis not present

## 2016-04-03 DIAGNOSIS — N8111 Cystocele, midline: Secondary | ICD-10-CM | POA: Diagnosis not present

## 2016-04-03 DIAGNOSIS — N814 Uterovaginal prolapse, unspecified: Secondary | ICD-10-CM | POA: Insufficient documentation

## 2016-04-03 DIAGNOSIS — Q504 Embryonic cyst of fallopian tube: Secondary | ICD-10-CM | POA: Diagnosis not present

## 2016-04-03 DIAGNOSIS — Z7951 Long term (current) use of inhaled steroids: Secondary | ICD-10-CM | POA: Diagnosis not present

## 2016-04-03 DIAGNOSIS — N86 Erosion and ectropion of cervix uteri: Secondary | ICD-10-CM | POA: Diagnosis not present

## 2016-04-03 DIAGNOSIS — N8502 Endometrial intraepithelial neoplasia [EIN]: Secondary | ICD-10-CM | POA: Diagnosis not present

## 2016-04-03 DIAGNOSIS — N950 Postmenopausal bleeding: Secondary | ICD-10-CM | POA: Diagnosis not present

## 2016-04-03 DIAGNOSIS — N9489 Other specified conditions associated with female genital organs and menstrual cycle: Secondary | ICD-10-CM | POA: Insufficient documentation

## 2016-04-03 DIAGNOSIS — N8110 Cystocele, unspecified: Secondary | ICD-10-CM | POA: Insufficient documentation

## 2016-04-03 DIAGNOSIS — N8501 Benign endometrial hyperplasia: Secondary | ICD-10-CM | POA: Diagnosis not present

## 2016-04-03 DIAGNOSIS — K219 Gastro-esophageal reflux disease without esophagitis: Secondary | ICD-10-CM | POA: Insufficient documentation

## 2016-04-03 DIAGNOSIS — F419 Anxiety disorder, unspecified: Secondary | ICD-10-CM | POA: Diagnosis not present

## 2016-04-03 DIAGNOSIS — Z79899 Other long term (current) drug therapy: Secondary | ICD-10-CM | POA: Diagnosis not present

## 2016-04-03 DIAGNOSIS — J309 Allergic rhinitis, unspecified: Secondary | ICD-10-CM | POA: Insufficient documentation

## 2016-04-03 DIAGNOSIS — N838 Other noninflammatory disorders of ovary, fallopian tube and broad ligament: Secondary | ICD-10-CM | POA: Insufficient documentation

## 2016-04-03 DIAGNOSIS — N8500 Endometrial hyperplasia, unspecified: Secondary | ICD-10-CM | POA: Diagnosis not present

## 2016-04-03 DIAGNOSIS — Z9889 Other specified postprocedural states: Secondary | ICD-10-CM | POA: Diagnosis not present

## 2016-04-03 HISTORY — PX: VAGINAL HYSTERECTOMY: SHX2639

## 2016-04-03 HISTORY — PX: CYSTOCELE REPAIR: SHX163

## 2016-04-03 SURGERY — HYSTERECTOMY, VAGINAL
Anesthesia: General | Wound class: Clean Contaminated

## 2016-04-03 MED ORDER — LACTATED RINGERS IV SOLN
INTRAVENOUS | Status: DC
  Administered 2016-04-03: 14:00:00 via INTRAVENOUS

## 2016-04-03 MED ORDER — ACETAMINOPHEN 10 MG/ML IV SOLN
INTRAVENOUS | Status: DC | PRN
  Administered 2016-04-03: 1000 mg via INTRAVENOUS

## 2016-04-03 MED ORDER — ACETAMINOPHEN 325 MG PO TABS: 650.0000 mg | ORAL_TABLET | ORAL | Status: DC | PRN

## 2016-04-03 MED ORDER — FENTANYL CITRATE (PF) 100 MCG/2ML IJ SOLN
INTRAMUSCULAR | Status: AC
  Administered 2016-04-03: 25 ug via INTRAVENOUS
  Filled 2016-04-03: qty 2

## 2016-04-03 MED ORDER — MIDAZOLAM HCL 2 MG/2ML IJ SOLN
INTRAMUSCULAR | Status: DC | PRN
  Administered 2016-04-03: 2 mg via INTRAVENOUS

## 2016-04-03 MED ORDER — ONDANSETRON HCL 4 MG/2ML IJ SOLN
4.0000 mg | Freq: Once | INTRAMUSCULAR | Status: AC | PRN
  Administered 2016-04-03: 4 mg via INTRAVENOUS

## 2016-04-03 MED ORDER — LIDOCAINE-EPINEPHRINE 1 %-1:100000 IJ SOLN
INTRAMUSCULAR | Status: AC
  Filled 2016-04-03: qty 2

## 2016-04-03 MED ORDER — SUGAMMADEX SODIUM 200 MG/2ML IV SOLN
INTRAVENOUS | Status: DC | PRN
  Administered 2016-04-03: 150 mg via INTRAVENOUS

## 2016-04-03 MED ORDER — KETOROLAC TROMETHAMINE 30 MG/ML IJ SOLN
30.0000 mg | Freq: Once | INTRAMUSCULAR | Status: AC
  Administered 2016-04-03: 30 mg via INTRAVENOUS

## 2016-04-03 MED ORDER — ONDANSETRON HCL 4 MG/2ML IJ SOLN
INTRAMUSCULAR | Status: AC
  Filled 2016-04-03: qty 2

## 2016-04-03 MED ORDER — OXYCODONE-ACETAMINOPHEN 5-325 MG PO TABS
ORAL_TABLET | ORAL | Status: AC
  Filled 2016-04-03: qty 1

## 2016-04-03 MED ORDER — KETAMINE HCL 50 MG/ML IJ SOLN
INTRAMUSCULAR | Status: DC | PRN
  Administered 2016-04-03: 25 mg via INTRAMUSCULAR

## 2016-04-03 MED ORDER — ONDANSETRON HCL 4 MG/2ML IJ SOLN
INTRAMUSCULAR | Status: DC | PRN
  Administered 2016-04-03: 4 mg via INTRAVENOUS

## 2016-04-03 MED ORDER — LIDOCAINE HCL (CARDIAC) 20 MG/ML IV SOLN
INTRAVENOUS | Status: DC | PRN
  Administered 2016-04-03: 80 mg via INTRAVENOUS

## 2016-04-03 MED ORDER — ROCURONIUM BROMIDE 100 MG/10ML IV SOLN
INTRAVENOUS | Status: DC | PRN
  Administered 2016-04-03: 30 mg via INTRAVENOUS
  Administered 2016-04-03: 10 mg via INTRAVENOUS

## 2016-04-03 MED ORDER — ACETAMINOPHEN 10 MG/ML IV SOLN
INTRAVENOUS | Status: AC
  Filled 2016-04-03: qty 100

## 2016-04-03 MED ORDER — PROPOFOL 10 MG/ML IV BOLUS
INTRAVENOUS | Status: DC | PRN
  Administered 2016-04-03: 50 mg via INTRAVENOUS
  Administered 2016-04-03: 100 mg via INTRAVENOUS

## 2016-04-03 MED ORDER — KETOROLAC TROMETHAMINE 30 MG/ML IJ SOLN
INTRAMUSCULAR | Status: AC
  Administered 2016-04-03: 30 mg via INTRAVENOUS
  Filled 2016-04-03: qty 1

## 2016-04-03 MED ORDER — LIDOCAINE-EPINEPHRINE 1 %-1:100000 IJ SOLN
INTRAMUSCULAR | Status: DC | PRN
  Administered 2016-04-03: 30 mL

## 2016-04-03 MED ORDER — DEXAMETHASONE SODIUM PHOSPHATE 10 MG/ML IJ SOLN
INTRAMUSCULAR | Status: DC | PRN
  Administered 2016-04-03: 4 mg via INTRAVENOUS

## 2016-04-03 MED ORDER — FENTANYL CITRATE (PF) 100 MCG/2ML IJ SOLN
25.0000 ug | INTRAMUSCULAR | Status: DC | PRN
  Administered 2016-04-03 (×4): 25 ug via INTRAVENOUS

## 2016-04-03 MED ORDER — CEFOXITIN SODIUM-DEXTROSE 2-2.2 GM-% IV SOLR (PREMIX)
INTRAVENOUS | Status: AC
  Administered 2016-04-03: 2000 mg via INTRAVENOUS
  Filled 2016-04-03: qty 50

## 2016-04-03 MED ORDER — OXYCODONE-ACETAMINOPHEN 5-325 MG PO TABS
1.0000 | ORAL_TABLET | ORAL | Status: DC | PRN
  Administered 2016-04-03: 1 via ORAL

## 2016-04-03 MED ORDER — PHENYLEPHRINE HCL 10 MG/ML IJ SOLN
INTRAMUSCULAR | Status: DC | PRN
  Administered 2016-04-03 (×3): 100 ug via INTRAVENOUS

## 2016-04-03 MED ORDER — ACETAMINOPHEN 650 MG RE SUPP: 650.0000 mg | RECTAL | Status: DC | PRN

## 2016-04-03 MED ORDER — ESTROGENS, CONJUGATED 0.625 MG/GM VA CREA
TOPICAL_CREAM | VAGINAL | Status: DC | PRN
  Administered 2016-04-03: 1 via VAGINAL

## 2016-04-03 MED ORDER — CEFOXITIN SODIUM-DEXTROSE 2-2.2 GM-% IV SOLR (PREMIX)
2.0000 g | Freq: Once | INTRAVENOUS | Status: AC
  Administered 2016-04-03: 2000 mg via INTRAVENOUS

## 2016-04-03 MED ORDER — FENTANYL CITRATE (PF) 100 MCG/2ML IJ SOLN
INTRAMUSCULAR | Status: DC | PRN
  Administered 2016-04-03 (×4): 50 ug via INTRAVENOUS

## 2016-04-03 MED ORDER — OXYCODONE-ACETAMINOPHEN 5-325 MG PO TABS: 1.0000 | ORAL_TABLET | ORAL | Status: DC | PRN

## 2016-04-03 MED ORDER — MORPHINE SULFATE (PF) 2 MG/ML IV SOLN: 1.0000 mg | INTRAVENOUS | Status: DC | PRN

## 2016-04-03 SURGICAL SUPPLY — 49 items
BAG URO DRAIN 2000ML W/SPOUT (MISCELLANEOUS) ×4 IMPLANT
BLADE SURG 15 STRL LF DISP TIS (BLADE) ×2 IMPLANT
BLADE SURG 15 STRL SS (BLADE) ×4
BLADE SURG SZ10 CARB STEEL (BLADE) ×4 IMPLANT
CANISTER SUCT 1200ML W/VALVE (MISCELLANEOUS) ×4 IMPLANT
CATH FOLEY 2WAY  5CC 16FR (CATHETERS) ×2
CATH FOLEY 2WAY 5CC 16FR (CATHETERS) ×2
CATH URTH 16FR FL 2W BLN LF (CATHETERS) ×2 IMPLANT
DRAPE PERI LITHO V/GYN (MISCELLANEOUS) ×4 IMPLANT
DRAPE SHEET LG 3/4 BI-LAMINATE (DRAPES) ×4 IMPLANT
DRAPE UNDER BUTTOCK W/FLU (DRAPES) ×4 IMPLANT
ELECT CAUTERY BLADE 6.4 (BLADE) ×4 IMPLANT
ELECT REM PT RETURN 9FT ADLT (ELECTROSURGICAL) ×4
ELECTRODE REM PT RTRN 9FT ADLT (ELECTROSURGICAL) ×2 IMPLANT
GAUZE PACK 2X3YD (MISCELLANEOUS) ×8 IMPLANT
GLOVE BIO SURGEON STRL SZ8 (GLOVE) ×8 IMPLANT
GOWN STRL REUS W/ TWL LRG LVL3 (GOWN DISPOSABLE) ×6 IMPLANT
GOWN STRL REUS W/ TWL XL LVL3 (GOWN DISPOSABLE) ×2 IMPLANT
GOWN STRL REUS W/TWL LRG LVL3 (GOWN DISPOSABLE) ×12
GOWN STRL REUS W/TWL XL LVL3 (GOWN DISPOSABLE) ×3
KIT RM TURNOVER CYSTO AR (KITS) ×4 IMPLANT
KIT RM TURNOVER STRD PROC AR (KITS) ×4 IMPLANT
LABEL OR SOLS (LABEL) ×4 IMPLANT
NDL HPO THNWL 1X22GA REG BVL (NEEDLE) ×2 IMPLANT
NDL MAYO CATGUT SZ4 (NEEDLE) ×4 IMPLANT
NEEDLE SAFETY 22GX1 (NEEDLE) ×4
NEEDLE SPNL 22GX3.5 QUINCKE BK (NEEDLE) ×4 IMPLANT
NS IRRIG 500ML POUR BTL (IV SOLUTION) ×4 IMPLANT
PACK BASIN MINOR ARMC (MISCELLANEOUS) ×4 IMPLANT
PAD OB MATERNITY 4.3X12.25 (PERSONAL CARE ITEMS) ×4 IMPLANT
PAD PREP 24X41 OB/GYN DISP (PERSONAL CARE ITEMS) ×4 IMPLANT
SOL PREP PVP 2OZ (MISCELLANEOUS) ×4
SOLUTION PREP PVP 2OZ (MISCELLANEOUS) ×2 IMPLANT
SPONGE LAP 4X18 5PK (MISCELLANEOUS) ×4 IMPLANT
SPONGE XRAY 4X4 16PLY STRL (MISCELLANEOUS) ×4 IMPLANT
STRAP SAFETY BODY (MISCELLANEOUS) ×4 IMPLANT
SUT ETHIBOND NAB CT1 #1 30IN (SUTURE) ×16 IMPLANT
SUT VIC AB 0 CT1 27 (SUTURE) ×12
SUT VIC AB 0 CT1 27XCR 8 STRN (SUTURE) ×6 IMPLANT
SUT VIC AB 0 CT1 36 (SUTURE) ×4 IMPLANT
SUT VIC AB 1 CT1 36 (SUTURE) ×4 IMPLANT
SUT VIC AB 2-0 CT1 (SUTURE) ×8 IMPLANT
SUT VIC AB 2-0 CT1 27 (SUTURE) ×3
SUT VIC AB 2-0 CT1 TAPERPNT 27 (SUTURE) ×2 IMPLANT
SUT VIC AB 3-0 SH 27 (SUTURE) ×4
SUT VIC AB 3-0 SH 27X BRD (SUTURE) ×2 IMPLANT
SYR BULB IRRIG 60ML STRL (SYRINGE) ×8 IMPLANT
SYR CONTROL 10ML (SYRINGE) ×4 IMPLANT
SYRINGE 10CC LL (SYRINGE) ×4 IMPLANT

## 2016-04-03 NOTE — H&P (Signed)
History and Physical Interval Note:  04/03/2016 2:54 PM  Hailey Wall  has presented today for surgery, with the diagnosis of COMPLEX ENDOMETRIAL HYPERPLASIA WITHOUT ATYPIA,POST MENOPAUSAL BLEEDING,CYSTOCELE,UTEROVAGINAL PROLAPSE  The various methods of treatment have been discussed with the patient and family. After consideration of risks, benefits and other options for treatment, the patient has consented to  Procedure(s): HYSTERECTOMY VAGINAL/BSO (Bilateral) ANTERIOR REPAIR (CYSTOCELE) (N/A) as a surgical intervention .  The patient's history has been reviewed, patient examined, no change in status, stable for surgery.  Pt has the following beta blocker history-  Not taking Beta Blocker.  I have reviewed the patient's chart and labs.  Questions were answered to the patient's satisfaction.       Hoyt Koch

## 2016-04-03 NOTE — Transfer of Care (Signed)
Immediate Anesthesia Transfer of Care Note  Patient: Hailey Wall  Procedure(s) Performed: Procedure(s): HYSTERECTOMY VAGINAL/BSO (Bilateral) ANTERIOR REPAIR (CYSTOCELE) (N/A)  Patient Location: PACU  Anesthesia Type:General  Level of Consciousness: awake  Airway & Oxygen Therapy: Patient Spontanous Breathing and Patient connected to face mask oxygen  Post-op Assessment: Report given to RN  Post vital signs: Reviewed  Last Vitals:  Filed Vitals:   04/03/16 1348 04/03/16 1725  BP: 147/65 132/57  Pulse: 91 81  Temp: 36.4 C 37 C  Resp: 18 18    Complications: No apparent anesthesia complications

## 2016-04-03 NOTE — Anesthesia Procedure Notes (Signed)
Procedure Name: Intubation Performed by: Lance Muss Pre-anesthesia Checklist: Emergency Drugs available, Patient identified, Suction available, Patient being monitored and Timeout performed Patient Re-evaluated:Patient Re-evaluated prior to inductionOxygen Delivery Method: Circle system utilized Preoxygenation: Pre-oxygenation with 100% oxygen Intubation Type: IV induction Ventilation: Mask ventilation without difficulty Laryngoscope Size: Mac and 3 Grade View: Grade I Tube type: Oral Tube size: 7.0 mm Number of attempts: 1 Airway Equipment and Method: Stylet Placement Confirmation: ETT inserted through vocal cords under direct vision,  positive ETCO2 and breath sounds checked- equal and bilateral Secured at: 22 cm Tube secured with: Tape Dental Injury: Teeth and Oropharynx as per pre-operative assessment

## 2016-04-03 NOTE — Anesthesia Postprocedure Evaluation (Signed)
Anesthesia Post Note  Patient: Hailey Wall  Procedure(s) Performed: Procedure(s) (LRB): HYSTERECTOMY VAGINAL/BSO (Bilateral) ANTERIOR REPAIR (CYSTOCELE) (N/A)  Patient location during evaluation: PACU Anesthesia Type: General Level of consciousness: awake and alert Pain management: pain level controlled Vital Signs Assessment: post-procedure vital signs reviewed and stable Respiratory status: spontaneous breathing, nonlabored ventilation, respiratory function stable and patient connected to nasal cannula oxygen Cardiovascular status: blood pressure returned to baseline and stable Postop Assessment: no signs of nausea or vomiting Anesthetic complications: no    Last Vitals:  Filed Vitals:   04/03/16 1821 04/03/16 1831  BP: 128/58 132/67  Pulse: 77 79  Temp:    Resp: 16 16    Last Pain:  Filed Vitals:   04/03/16 1832  PainSc: Hailey Wall

## 2016-04-03 NOTE — Anesthesia Preprocedure Evaluation (Signed)
Anesthesia Evaluation  Patient identified by MRN, date of birth, ID band Patient awake    Reviewed: Allergy & Precautions, H&P , NPO status , Patient's Chart, lab work & pertinent test results, reviewed documented beta blocker date and time   History of Anesthesia Complications Negative for: history of anesthetic complications  Airway Mallampati: II  TM Distance: >3 FB Neck ROM: full    Dental no notable dental hx. (+) Edentulous Upper, Upper Dentures, Edentulous Lower, Lower Dentures   Pulmonary neg shortness of breath, neg sleep apnea, neg COPD, neg recent URI, former smoker,    Pulmonary exam normal breath sounds clear to auscultation       Cardiovascular Exercise Tolerance: Good hypertension, + Peripheral Vascular Disease  (-) CAD, (-) Past MI, (-) Cardiac Stents and (-) CABG Normal cardiovascular exam(-) dysrhythmias (-) Valvular Problems/Murmurs Rhythm:regular Rate:Normal     Neuro/Psych PSYCHIATRIC DISORDERS (Anxiety) negative neurological ROS     GI/Hepatic Neg liver ROS, GERD  Medicated,  Endo/Other  negative endocrine ROS  Renal/GU negative Renal ROS  negative genitourinary   Musculoskeletal   Abdominal   Peds  Hematology negative hematology ROS (+)   Anesthesia Other Findings Past Medical History:   TMJ arthralgia                                               Allergy                                                      Osteoporosis                                                 Anxiety                                                      Menopause                                                    Atrophic vaginitis                                           Stress incontinence                                          Insomnia  Osteopenia                                                   GERD (gastroesophageal reflux disease)                      Hyperlipidemia                                               Vaginal bleeding                                             Varicose vein of leg                                           Comment:Rt leg   Reproductive/Obstetrics negative OB ROS                             Anesthesia Physical Anesthesia Plan  ASA: II  Anesthesia Plan: General   Post-op Pain Management:    Induction:   Airway Management Planned:   Additional Equipment:   Intra-op Plan:   Post-operative Plan:   Informed Consent: I have reviewed the patients History and Physical, chart, labs and discussed the procedure including the risks, benefits and alternatives for the proposed anesthesia with the patient or authorized representative who has indicated his/her understanding and acceptance.   Dental Advisory Given  Plan Discussed with: Anesthesiologist, CRNA and Surgeon  Anesthesia Plan Comments:         Anesthesia Quick Evaluation

## 2016-04-03 NOTE — Discharge Instructions (Signed)
Vaginal Hysterectomy, Care After Refer to this sheet in the next few weeks. These instructions provide you with information on caring for yourself after your procedure. Your health care provider may also give you more specific instructions. Your treatment has been planned according to current medical practices, but problems sometimes occur. Call your health care provider if you have any problems or questions after your procedure. WHAT TO EXPECT AFTER THE PROCEDURE After your procedure, it is typical to have the following:  Abdominal pain. You will be given pain medicine to control it.  Sore throat from the breathing tube that was inserted during surgery. HOME CARE INSTRUCTIONS  Only take over-the-counter or prescription medicines for pain, discomfort, or fever as directed by your health care provider.  Do not take aspirin. It can cause bleeding.  Do not drive when taking pain medicine.  Follow your health care provider's advice regarding diet, exercise, lifting, driving, and general activities.  Resume your usual diet as directed and allowed.  Get plenty of rest and sleep.  Do not douche, use tampons, or have sexual intercourse for at least 6 weeks, or until your health care provider gives you permission.  Change your bandages (dressings) as directed by your health care provider.  Monitor your temperature and notify your health care provider of a fever.  Take showers instead of baths for 2-3 weeks.  Do not drink alcohol until your health care provider gives you permission.  If you develop constipation, you may take a mild laxative with your health care provider's permission. Bran foods may help with constipation problems. Drinking enough fluids to keep your urine clear or pale yellow may help as well.  Try to have someone home with you for 1-2 weeks to help around the house.  Keep all of your follow-up appointments as directed by your health care provider. SEEK MEDICAL CARE IF:     You feel dizzy or lightheaded.  You have pain or bleeding when you urinate.  You have persistent diarrhea.  You have persistent nausea and vomiting.  You have abnormal vaginal discharge.  You have a rash.  You have any type of abnormal reaction or develop an allergy to your medicine.  You have poor pain control with your prescribed medicine. SEEK IMMEDIATE MEDICAL CARE IF:   You have a fever.  You have severe abdominal pain.  You have chest pain.  You have shortness of breath.  You faint.  You have pain, swelling, or redness in your leg.  You have heavy vaginal bleeding with blood clots. MAKE SURE YOU:  Understand these instructions.  Will watch your condition.  Will get help right away if you are not doing well or get worse.   This information is not intended to replace advice given to you by your health care provider. Make sure you discuss any questions you have with your health care provider.   Document Released: 12/04/2011 Document Revised: 12/20/2013 Document Reviewed: 06/30/2013 Elsevier Interactive Patient Education 2016 [place]   1) The drugs that you were given will stay in your system until tomorrow so for the next 24 hours you should not:  A) Drive an automobile B) Make any legal decisions C) Drink any alcoholic beverage   2) You may resume regular meals tomorrow.  Today it is better to start with liquids and gradually work up to solid foods.  You may eat anything you prefer, but it is better to start with liquids, then  soup and crackers, and gradually work up to solid foods.   3) Please notify your doctor immediately if you have any unusual bleeding, trouble breathing, redness and pain at the surgery site, drainage, fever, or pain not relieved by medication.    4) Additional Instructions:    Please contact your physician with any problems or Same Day Surgery at [phone], Monday  through Friday 6 am to 4 pm, or [facility] number at [phone].

## 2016-04-03 NOTE — Op Note (Signed)
Preoperative diagnosis: Complex Endometrial Hyperplasia without atypia, Pelvic Organ Prolapse, Cystocele  Postoperative diagnosis: Same  Procedure: Transvaginal hysterectomy, BSO, Anterior Repair, Modified McCall Culdoplasty  Surgeon: Barnett Applebaum, M.D.  Assistant: Marisue Brooklyn, MD  Anesthesia: general  Findings: Small mobile uterus w Gr 2 Prolapse, Gr 1 Cystocele. Left ovarian simple cyst.  Estimated blood loss: 25 cc  Specimen: Uterus to pathology   Disposition: Returned to recovery unit in stable condition  Procedure: Patient was taken to the OR where she was placed in dorsal lithotomy in [place]. She was prepped and draped in the usual sterile fashion. A timeout was performed. Foley is placed into bladder. A speculum was placed inside the vagina. The cervix was visualized and grasped with a tenaculum. 1% lidocaine with epinephrine were injected paracervically. A bovie was used to make a circumferential incision around the vagina. An opened sponge was used to dissect the vagina off the cervix. The posterior peritoneum was entered sharply with Mayo scissors.  The anterior peritoneal cavity was entered sharply with careful dissection of the bladder off the underlying cervix. A Heaney clamp was used to clamp first the left uterosacral ligament and cardinal which was then cut and Haney suture ligated with 0 Vicryl stitch, the stitch was held and later attached to the vaginal mucosa. Similarly the right uterosacral ligament was clamped cut and suture ligated.  Sequential clamping, transecting and suture ligating up the broad to the uterine arteries were taken until the tubo-ovarian pedicles were encountered. The uterus was then amputated. The left utero-ovarian pedicle grasped with a Heaney clamp. The right utero-ovarian pedicle was similarly grasped with the Heaney clamp. The right left ovaries were easily visualized. The left ovary and tube were grasped with Babcock clamp and a Heaney clamp  placed behind this. Tube and ovary were removed and the IP ligated with a free tie followed by suture ligature. The right tube and ovary were similarly grasped with a Babcock clamp and a Heaney clamp used to clamp behind this. The tube and ovary were removed on the side and the IP was ligated with a free tie followed by suture ligature. Inspection of all pedicles revealed adequate hemostasis.  The peritoneum was then closed with a running pursestring suture of 0 Vicryl. The uterosacrals were plicated using a 1-0 Ethibond suture.  Vagina is irrigated. Anterior colporrhaphy is performed.  Allis clamps are placed along the anterior vaginal wall, lidocaine is used to infiltrate the plane, and incision is made midline vertical.  Endopelvic fascia is dissected free of vaginal mucosa.  Fascia is plicated w interrupted vicryl sutures.  Excess mucosa is excised.  Vaginal incision is closed with a running locking Vicryl suture, to incoprate the hysterectomy incision as well, with care taken to incorporate the uterosacral pedicles. Excellent hemostasis was noted at the end of the case. The vaginal cuff was inspected there was minimal bleeding noted.  Packing gauze w Premarin cream is placed. A Foley catheter is left in  place inside her bladder. Clear, yellow urine was noted. All instrument needle and lap counts were correct x 2. Patient was awakened taken to recovery room in stable condition.  Hoyt Koch, MD 04/03/2016, 5:19 PM

## 2016-04-04 ENCOUNTER — Encounter: Payer: Self-pay | Admitting: Obstetrics & Gynecology

## 2016-04-07 LAB — SURGICAL PATHOLOGY

## 2016-04-10 DIAGNOSIS — J301 Allergic rhinitis due to pollen: Secondary | ICD-10-CM | POA: Diagnosis not present

## 2016-04-15 DIAGNOSIS — Z09 Encounter for follow-up examination after completed treatment for conditions other than malignant neoplasm: Secondary | ICD-10-CM | POA: Diagnosis not present

## 2016-04-17 DIAGNOSIS — J301 Allergic rhinitis due to pollen: Secondary | ICD-10-CM | POA: Diagnosis not present

## 2016-04-24 DIAGNOSIS — J301 Allergic rhinitis due to pollen: Secondary | ICD-10-CM | POA: Diagnosis not present

## 2016-04-24 DIAGNOSIS — L03115 Cellulitis of right lower limb: Secondary | ICD-10-CM | POA: Diagnosis not present

## 2016-05-01 DIAGNOSIS — J301 Allergic rhinitis due to pollen: Secondary | ICD-10-CM | POA: Diagnosis not present

## 2016-05-15 DIAGNOSIS — J301 Allergic rhinitis due to pollen: Secondary | ICD-10-CM | POA: Diagnosis not present

## 2016-05-15 DIAGNOSIS — Z09 Encounter for follow-up examination after completed treatment for conditions other than malignant neoplasm: Secondary | ICD-10-CM | POA: Diagnosis not present

## 2016-05-20 DIAGNOSIS — J301 Allergic rhinitis due to pollen: Secondary | ICD-10-CM | POA: Diagnosis not present

## 2016-05-29 DIAGNOSIS — J301 Allergic rhinitis due to pollen: Secondary | ICD-10-CM | POA: Diagnosis not present

## 2016-06-05 DIAGNOSIS — J301 Allergic rhinitis due to pollen: Secondary | ICD-10-CM | POA: Diagnosis not present

## 2016-06-19 DIAGNOSIS — J301 Allergic rhinitis due to pollen: Secondary | ICD-10-CM | POA: Diagnosis not present

## 2016-06-26 DIAGNOSIS — J301 Allergic rhinitis due to pollen: Secondary | ICD-10-CM | POA: Diagnosis not present

## 2016-07-03 DIAGNOSIS — J301 Allergic rhinitis due to pollen: Secondary | ICD-10-CM | POA: Diagnosis not present

## 2016-07-08 ENCOUNTER — Encounter: Payer: Self-pay | Admitting: Unknown Physician Specialty

## 2016-07-08 ENCOUNTER — Ambulatory Visit (INDEPENDENT_AMBULATORY_CARE_PROVIDER_SITE_OTHER): Payer: PPO | Admitting: Unknown Physician Specialty

## 2016-07-08 VITALS — BP 110/69 | HR 66 | Temp 97.9°F | Ht 66.2 in | Wt 145.8 lb

## 2016-07-08 DIAGNOSIS — M8580 Other specified disorders of bone density and structure, unspecified site: Secondary | ICD-10-CM

## 2016-07-08 DIAGNOSIS — Z23 Encounter for immunization: Secondary | ICD-10-CM

## 2016-07-08 DIAGNOSIS — Z0000 Encounter for general adult medical examination without abnormal findings: Secondary | ICD-10-CM

## 2016-07-08 DIAGNOSIS — I10 Essential (primary) hypertension: Secondary | ICD-10-CM

## 2016-07-08 DIAGNOSIS — E785 Hyperlipidemia, unspecified: Secondary | ICD-10-CM | POA: Diagnosis not present

## 2016-07-08 MED ORDER — ALPRAZOLAM 0.5 MG PO TABS: 0.5000 mg | ORAL_TABLET | Freq: Every evening | ORAL | Status: DC | PRN

## 2016-07-08 NOTE — Progress Notes (Signed)
BP 110/69 mmHg  Pulse 66  Temp(Src) 97.9 F (36.6 C)  Ht 5' 6.2" (1.681 m)  Wt 145 lb 12.8 oz (66.134 kg)  BMI 23.40 kg/m2  SpO2 98%  LMP  (LMP Unknown)   Subjective:    Patient ID: Hailey Wall, female    DOB: Nov 15, 1946, 70 y.o.   MRN: [id number]  HPI: Hailey Wall is a 70 y.o. female  Chief Complaint  Patient presents with  . Medicare Wellness   Social History   Social History  . Marital Status: Married    Spouse Name: N/A  . Number of Children: N/A  . Years of Education: N/A   Occupational History  . Not on file.   Social History Main Topics  . Smoking status: Former Smoker    Quit date: 03/20/2006  . Smokeless tobacco: Never Used  . Alcohol Use: No  . Drug Use: No  . Sexual Activity: No   Other Topics Concern  . Not on file   Social History Narrative   Family History  Problem Relation Age of Onset  . Heart disease Mother     CHF  . Mental illness Mother   . COPD Mother   . Emphysema Mother   . Scoliosis Daughter    Past Medical History  Diagnosis Date  . TMJ arthralgia   . Allergy   . Osteoporosis   . Anxiety   . Menopause   . Atrophic vaginitis   . Stress incontinence   . Insomnia   . Osteopenia   . GERD (gastroesophageal reflux disease)   . Hyperlipidemia   . Vaginal bleeding   . Varicose vein of leg     Rt leg   Past Surgical History  Procedure Laterality Date  . Dilation and curettage of uterus    . Two bunions removed Bilateral   . Jaw surgery    . Vaginal hysterectomy Bilateral 04/03/2016    Procedure: HYSTERECTOMY VAGINAL/BSO;  Surgeon: Gae Dry, MD;  Location: [facility] ORS;  Service: Gynecology;  Laterality: Bilateral;  . 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 No    Depression screen PHQ 2/9 07/08/2016  Decreased Interest 0  Down, Depressed, Hopeless 0  PHQ - 2 Score 0   Pt does not have advance directives as she said "I can't afford it."  She states that "no plugs are to be pulled."    Anxiety Pt taking Xanax every other day which she has been on for a while-  History of Hyperlipidemia and Hypertension that is diet controlled  Relevant past medical, surgical, family and social history reviewed and updated as indicated. Interim medical history since our last visit reviewed. Allergies and medications reviewed and updated.  Review of Systems  Constitutional: Negative.   HENT: Negative.   Eyes: Negative.   Respiratory: Negative.   Cardiovascular: Negative.   Gastrointestinal: Negative.   Endocrine: Negative.   Genitourinary: Negative.   Musculoskeletal: Negative.   Skin: Negative.   Allergic/Immunologic: Negative.   Neurological: Negative.   Hematological: Negative.   Psychiatric/Behavioral: Negative.     Per HPI unless specifically indicated above     Objective:    BP 110/69 mmHg  Pulse 66  Temp(Src) 97.9 F (36.6 C)  Ht 5' 6.2" (1.681 m)  Wt 145 lb 12.8 oz (66.134 kg)  BMI 23.40 kg/m2  SpO2 98%  LMP  (LMP Unknown)  Wt Readings from Last 3 Encounters:  07/08/16 145 lb 12.8 oz (66.134 kg)  04/03/16 144 lb (65.318 kg)  03/20/16 150 lb (68.04 kg)    Physical Exam  Results for orders placed or  performed during the hospital encounter of 04/03/16  Surgical pathology  Result Value Ref Range   SURGICAL PATHOLOGY      Surgical Pathology CASE: [id number] PATIENT: Hailey Wall Surgical Pathology Report     SPECIMEN SUBMITTED: A. Uterus with cervix, bilateral tubes and ovaries  CLINICAL HISTORY: None provided  PRE-OPERATIVE DIAGNOSIS: Complex endometrial hyperplasia without atypia, post menopausal bleeding, cystocele, utero-vaginal prolapse  POST-OPERATIVE DIAGNOSIS: Same as pre-op     DIAGNOSIS: A. UTERUS WITH CERVIX, BILATERAL FALLOPIAN TUBES AND OVARIES; HYSTERECTOMY WITH BILATERAL SALPINGO-OOPHORECTOMY: - CERVIX WITH EROSION. - ATYPICAL HYPERPLASIA/ENDOMETRIOID INTRAEPITHELIAL NEOPLASIA OF ENDOMETRIUM. - LEIOMYOMA, 1.6 CM; WITHOUT ATYPIA, NECROSIS OR INCREASED MITOSES. - BILATERAL OVARIES WITH AGE-RELATED CHANGE, UNILATERAL ENDOSALPINGOSIS.  - UNILATERAL FALLOPIAN TUBE WITH PARATUBAL CYST.   GROSS DESCRIPTION:  A. The specimen is received in a formalin-filled container labeled with the patient's name and uterus, bilateral tubes and ovaries.  Adnexa: b ilateral tubes and ovaries (unattached) Weight: 60 grams Dimensions:      Fundus -4.0 x 3.5 x 2.7 cm      Cervix -3.5 x 3.6 cm Serosa: purple tan predominantly smooth Cervix: smooth pink-tan Endocervix: trabecular pink-tan Endometrial cavity:      Dimensions -1.7 x 1.6 cm      Thickness -0.2 cm      Other findings -none noted Myometrium:     Thickness -trabecular pink-tan cm     Other findings -with a 1.6 x 1.4 x 1.4 cm white whorled nodule Adnexa:       First ovary           Total Weight -7 grams           Measurement -2.9 x 1.8 x 0.8 cm           Serosa -lobulated yellow-tan           Cut surface -heterogeneous yellow-tan a 0.9 cm in diameter unilocular clear fluid-filled cysts      First fallopian tube (attached)           Measurements -2.6 cm in length x 1.0 cm in diameter            Other findings -fimbriated with a 2.5 x 1.5 x 1.0 cm clear viscous fluid-filled cysts     Second ovary           Total Weight -3 grams           Measurement -1.8 x 1.1 x 0.8  cm           Serosa -lobulated yellow purple           Cut surface -heterogeneous pink-tan  Second fallopian tube (attached)            Measurements -2.3 cm in length x 1.1 cm in diameter           Other findings -fimbriated Other comments: none noted  Block summary: 1-representative anterior cervix to lower uterine segment 2-representative posterior cervix to lower uterine segment 3-5-entire anterior endometrium from lower uterine segment towards fundus respectively which includes white whorled nodule 6-8-entire posterior endometrium from lower uterine segment towards fundus respectively 9-representative first ovary 10-11-entire cyst attached to first fallopian tube and ovary 12-cross-sections and longitudinal fimbriated end first fallopian tube (entire fallopian tube submitted) 13-representative second ovary 14-representative cross-sections and longitudinal fimbriated end second fallopian tube  Final Diagnosis performed by Delorse Lek, MD.  Electronically si gned 04/07/2016 12:19:05PM    The electronic signature indicates that the named Attending Pathologist has evaluated the specimen  Technical component performed at [facility], [address] [place], [place] [address] Lab: [phone] Dir: Darrick Penna. Evette Doffing, MD  Professional component performed at [facility], [facility], [place], [place], [place] [address] Lab: [phone] Dir: Dellia Nims. Reuel Derby, MD        Assessment & Plan:   Problem List Items Addressed This Visit      Unprioritized   Hyperlipidemia    Past history.  Check Lipid panel      Relevant Orders   Lipid Panel w/o Chol/HDL Ratio   Hypertension    Borderline blood pressure at times.  Usually related to stress      Relevant Orders   Comprehensive  metabolic panel   Lipid Panel w/o Chol/HDL Ratio   Osteopenia   Relevant Orders   VITAMIN D 25 Hydroxy (Vit-D Deficiency, Fractures)    Other Visit Diagnoses    Need for vaccine for TD (tetanus-diphtheria)    -  Primary    Relevant Orders    Td vaccine greater than or equal to 7yo preservative free IM (Completed)    Routine general medical examination at a health care facility        Relevant Orders    Cologuard    MM DIGITAL SCREENING BILATERAL    Hepatitis C antibody        Follow up plan: Return in about 6 months (around 01/08/2017).

## 2016-07-08 NOTE — Assessment & Plan Note (Signed)
Past history.  Check Lipid panel

## 2016-07-08 NOTE — Patient Instructions (Addendum)
Td Vaccine (Tetanus and Diphtheria): What You Need to Know 1. Why get vaccinated? Tetanus  and diphtheria are very serious diseases. They are rare in the [place] today, but people who do become infected often have severe complications. Td vaccine is used to protect adolescents and adults from both of these diseases. Both tetanus and diphtheria are infections caused by bacteria. Diphtheria spreads from person to person through coughing or sneezing. Tetanus-causing bacteria enter the body through cuts, scratches, or wounds. TETANUS (Lockjaw) causes painful muscle tightening and stiffness, usually all over the body.  It can lead to tightening of muscles in the head and neck so you can't open your mouth, swallow, or sometimes even breathe. Tetanus kills about 1 out of every 10 people who are infected even after receiving the best medical care. DIPHTHERIA can cause a thick coating to form in the back of the throat.  It can lead to breathing problems, paralysis, heart failure, and death. Before vaccines, as many as 200,000 cases of diphtheria and hundreds of cases of tetanus were reported in the [place] each year. Since vaccination began, reports of cases for both diseases have dropped by about 99%. 2. Td vaccine Td vaccine can protect adolescents and adults from tetanus and diphtheria. Td is usually given as a booster dose every 10 years but it can also be given earlier after a severe and dirty wound or burn. Another vaccine, called Tdap, which protects against pertussis in addition to tetanus and diphtheria, is sometimes recommended instead of Td vaccine. Your doctor or the person giving you the vaccine can give you more information. Td may safely be given at the same time as other vaccines. 3. Some people should not get this vaccine  A person who has ever had a life-threatening allergic reaction after a previous dose of any tetanus or diphtheria containing vaccine, OR has a severe allergy  to any part of this vaccine, should not get Td vaccine. Tell the person giving the vaccine about any severe allergies.  Talk to your doctor if you:  have seizures or another nervous system problem,  had severe pain or swelling after any vaccine containing diphtheria or tetanus,  ever had a condition called Guillain Barre Syndrome (GBS),  aren't feeling well on the day the shot is scheduled. 4. Risks of a vaccine reaction With any medicine, including vaccines, there is a chance of side effects. These are usually mild and go away on their own. Serious reactions are also possible but are rare. Most people who get Td vaccine do not have any problems with it. Mild Problems  following Td vaccine: (Did not interfere with activities)  Pain where the shot was given (about 8 people in 10)  Redness or swelling where the shot was given (about 1 person in 4)  Mild fever (rare)  Headache (about 1 person in 4)  Tiredness (about 1 person in 4) Moderate Problems following Td vaccine: (Interfered with activities, but did not require medical attention)  Fever over 102F (rare) Severe Problems  following Td vaccine: (Unable to perform usual activities; required medical attention)  Swelling, severe pain, bleeding and/or redness in the arm where the shot was given (rare). Problems that could happen after any vaccine:  People sometimes faint after a medical procedure, including vaccination. Sitting or lying down for about 15 minutes can help prevent fainting, and injuries caused by a fall. Tell your doctor if you feel dizzy, or have vision changes or ringing in the ears.  Some people get severe pain in the shoulder and have difficulty moving the arm where a shot was given. This happens very rarely.  Any medication can cause a severe allergic reaction. Such reactions from a vaccine are very rare, estimated at fewer than 1 in a million doses, and would happen within a few minutes to a few hours after  the vaccination. As with any medicine, there is a very remote chance of a vaccine causing a serious injury or death. The safety of vaccines is always being monitored. For more information, visit: http://www.aguilar.org/ 5. What if there is a serious reaction? What should I look for?  Look for anything that concerns you, such as signs of a severe allergic reaction, very high fever, or unusual behavior. Signs of a severe allergic reaction can include hives, swelling of the face and throat, difficulty breathing, a fast heartbeat, dizziness, and weakness. These would usually start a few minutes to a few hours after the vaccination. What should I do?  If you think it is a severe allergic reaction or other emergency that can't wait, call 9-1-1 or get the person to the nearest hospital. Otherwise, call your doctor.  Afterward, the reaction should be reported to the Vaccine Adverse Event Reporting System (VAERS). Your doctor might file this report, or you can do it yourself through the VAERS web site at www.vaers.SamedayNews.es, or by calling 712-603-8320. VAERS does not give medical advice. 6. The National Vaccine Injury Compensation Program The [employer] Vaccine Injury Compensation Program (VICP) is a federal program that was created to compensate people who may have been injured by certain vaccines. Persons who believe they may have been injured by a vaccine can learn about the program and about filing a claim by calling (864) 205-2920 or visiting the [place] website at GoldCloset.com.ee. There is a time limit to file a claim for compensation. 7. How can I learn more?  Ask your doctor. He or she can give you the vaccine package insert or suggest other sources of information.  Call your local or state health department.  Contact the Centers for Disease Control and Prevention (CDC):  Call (972) 141-4247 (1-800-CDC-INFO)  Visit CDC's website at http://hunter.com/ CDC Td Vaccine VIS  (02/21/14)   This information is not intended to replace advice given to you by your health care provider. Make sure you discuss any questions you have with your health care provider.   -----------------------------------------------------------------------------------------------------------  Please do call to schedule your mammogram; the number to schedule one at either [facility] or [facility] is [phone]  Pneumococcal Conjugate Vaccine (PCV13)  1. Why get vaccinated? Vaccination can protect both children and adults from pneumococcal disease. Pneumococcal disease is caused by bacteria that can spread from person to person through close contact. It can cause ear infections, and it can also lead to more serious infections of the:  Lungs (pneumonia),  Blood (bacteremia), and  Covering of the brain and spinal cord (meningitis). Pneumococcal pneumonia is most common among adults. Pneumococcal meningitis can cause deafness and brain damage, and it kills about 1 child in 10 who get it. Anyone can get pneumococcal disease, but children under 72 years of age and adults 85 years and older, people with certain medical conditions, and cigarette smokers are at the highest risk. Before there was a vaccine, the [place] saw:  more than 700 cases of meningitis,  about 13,000 blood infections,  about 5 million ear infections, and  about 200 deaths in children under 5 each  year from pneumococcal disease. Since vaccine became available, severe pneumococcal disease in these children has fallen by 88%. About 18,000 older adults die of pneumococcal disease each year in the [place]. Treatment of pneumococcal infections with penicillin and other drugs is not as effective as it used to be, because some strains of the disease have become resistant to these drugs. This makes prevention of the disease, through vaccination, even more important. 2. PCV13  vaccine Pneumococcal conjugate vaccine (called PCV13) protects against 13 types of pneumococcal bacteria. PCV13 is routinely given to children at 2, 4, 6, and 73-31 months of age. It is also recommended for children and adults 39 to 29 years of age with certain health conditions, and for all adults 8 years of age and older. Your doctor can give you details. 3. Some people should not get this vaccine Anyone who has ever had a life-threatening allergic reaction to a dose of this vaccine, to an earlier pneumococcal vaccine called PCV7, or to any vaccine containing diphtheria toxoid (for example, DTaP), should not get PCV13. Anyone with a severe allergy to any component of PCV13 should not get the vaccine. Tell your doctor if the person being vaccinated has any severe allergies. If the person scheduled for vaccination is not feeling well, your healthcare provider might decide to reschedule the shot on another day. 4. Risks of a vaccine reaction With any medicine, including vaccines, there is a chance of reactions. These are usually mild and go away on their own, but serious reactions are also possible. Problems reported following PCV13 varied by age and dose in the series. The most common problems reported among children were:  About half became drowsy after the shot, had a temporary loss of appetite, or had redness or tenderness where the shot was given.  About 1 out of 3 had swelling where the shot was given.  About 1 out of 3 had a mild fever, and about 1 in 20 had a fever over 102.72F.  Up to about 8 out of 10 became fussy or irritable. Adults have reported pain, redness, and swelling where the shot was given; also mild fever, fatigue, headache, chills, or muscle pain. Young children who get PCV13 along with inactivated flu vaccine at the same time may be at increased risk for seizures caused by fever. Ask your doctor for more information. Problems that could happen after any vaccine:  People  sometimes faint after a medical procedure, including vaccination. Sitting or lying down for about 15 minutes can help prevent fainting, and injuries caused by a fall. Tell your doctor if you feel dizzy, or have vision changes or ringing in the ears.  Some older children and adults get severe pain in the shoulder and have difficulty moving the arm where a shot was given. This happens very rarely.  Any medication can cause a severe allergic reaction. Such reactions from a vaccine are very rare, estimated at about 1 in a million doses, and would happen within a few minutes to a few hours after the vaccination. As with any medicine, there is a very small chance of a vaccine causing a serious injury or death. The safety of vaccines is always being monitored. For more information, visit: http://www.aguilar.org/ 5. What if there is a serious reaction? What should I look for?  Look for anything that concerns you, such as signs of a severe allergic reaction, very high fever, or unusual behavior. Signs of a severe allergic reaction can include hives, swelling of the  face and throat, difficulty breathing, a fast heartbeat, dizziness, and weakness-usually within a few minutes to a few hours after the vaccination. What should I do?  If you think it is a severe allergic reaction or other emergency that can't wait, call 9-1-1 or get the person to the nearest hospital. Otherwise, call your doctor. Reactions should be reported to the Vaccine Adverse Event Reporting System (VAERS). Your doctor should file this report, or you can do it yourself through the VAERS web site at www.vaers.SamedayNews.es, or by calling 954-165-4558. VAERS does not give medical advice. 6. The National Vaccine Injury Compensation Program The [employer] Vaccine Injury Compensation Program (VICP) is a federal program that was created to compensate people who may have been injured by certain vaccines. Persons who believe they may have been injured  by a vaccine can learn about the program and about filing a claim by calling 662-874-0478 or visiting the [place] website at GoldCloset.com.ee. There is a time limit to file a claim for compensation. 7. How can I learn more?  Ask your healthcare provider. He or she can give you the vaccine package insert or suggest other sources of information.  Call your local or state health department.  Contact the Centers for Disease Control and Prevention (CDC):  Call 504-362-7059 (1-800-CDC-INFO) or  Visit CDC's website at http://hunter.com/ Vaccine Information Statement PCV13 Vaccine (11/02/2014)   This information is not intended to replace advice given to you by your health care provider. Make sure you discuss any questions you have with your health care provider.   Document Released: 10/12/2006 Document Revised: 01/05/2015 Document Reviewed: 11/09/2014 Elsevier Interactive Patient Education [employer].

## 2016-07-08 NOTE — Assessment & Plan Note (Signed)
Borderline blood pressure at times.  Usually related to stress

## 2016-07-09 ENCOUNTER — Encounter: Payer: Self-pay | Admitting: Unknown Physician Specialty

## 2016-07-09 LAB — LIPID PANEL W/O CHOL/HDL RATIO
Cholesterol, Total: 229 mg/dL — ABNORMAL HIGH (ref 100–199)
HDL: 114 mg/dL (ref >39)
LDL CALC: 76 mg/dL (ref 0–99)
Triglycerides: 195 mg/dL — ABNORMAL HIGH (ref 0–149)
VLDL Cholesterol Cal: 39 mg/dL (ref 5–40)

## 2016-07-09 LAB — COMPREHENSIVE METABOLIC PANEL
A/G RATIO: 1.6 (ref 1.2–2.2)
ALBUMIN: 4.4 g/dL (ref 3.6–4.8)
ALT: 10 IU/L (ref 0–32)
AST: 14 IU/L (ref 0–40)
Alkaline Phosphatase: 82 IU/L (ref 39–117)
BILIRUBIN TOTAL: 0.3 mg/dL (ref 0.0–1.2)
BUN / CREAT RATIO: 25 (ref 12–28)
BUN: 18 mg/dL (ref 8–27)
CALCIUM: 9.8 mg/dL (ref 8.7–10.3)
CO2: 26 mmol/L (ref 18–29)
Chloride: 101 mmol/L (ref 96–106)
Creatinine, Ser: 0.72 mg/dL (ref 0.57–1.00)
GFR calc Af Amer: 100 mL/min/{1.73_m2} (ref >59)
GFR, EST NON AFRICAN AMERICAN: 86 mL/min/{1.73_m2} (ref >59)
GLOBULIN, TOTAL: 2.8 g/dL (ref 1.5–4.5)
Glucose: 91 mg/dL (ref 65–99)
POTASSIUM: 4.8 mmol/L (ref 3.5–5.2)
SODIUM: 143 mmol/L (ref 134–144)
TOTAL PROTEIN: 7.2 g/dL (ref 6.0–8.5)

## 2016-07-09 LAB — VITAMIN D 25 HYDROXY (VIT D DEFICIENCY, FRACTURES): VIT D 25 HYDROXY: 39.3 ng/mL (ref 30.0–100.0)

## 2016-07-09 LAB — HEPATITIS C ANTIBODY

## 2016-07-09 NOTE — Progress Notes (Signed)
Quick Note:  Normal labs. Patient notified by letter. ______ 

## 2016-07-14 ENCOUNTER — Telehealth: Payer: Self-pay | Admitting: Unknown Physician Specialty

## 2016-07-14 NOTE — Telephone Encounter (Signed)
Called and left a message.

## 2016-07-14 NOTE — Telephone Encounter (Signed)
Routing to provider  

## 2016-07-14 NOTE — Telephone Encounter (Signed)
Patient called stated she has a question about her lab results. Please call her ASAP. Thanks.

## 2016-07-17 DIAGNOSIS — J301 Allergic rhinitis due to pollen: Secondary | ICD-10-CM | POA: Diagnosis not present

## 2016-07-24 DIAGNOSIS — J301 Allergic rhinitis due to pollen: Secondary | ICD-10-CM | POA: Diagnosis not present

## 2016-08-07 DIAGNOSIS — J301 Allergic rhinitis due to pollen: Secondary | ICD-10-CM | POA: Diagnosis not present

## 2016-08-12 DIAGNOSIS — J301 Allergic rhinitis due to pollen: Secondary | ICD-10-CM | POA: Diagnosis not present

## 2016-08-12 DIAGNOSIS — H6123 Impacted cerumen, bilateral: Secondary | ICD-10-CM | POA: Diagnosis not present

## 2016-08-12 DIAGNOSIS — H6980 Other specified disorders of Eustachian tube, unspecified ear: Secondary | ICD-10-CM | POA: Diagnosis not present

## 2016-08-12 DIAGNOSIS — H7202 Central perforation of tympanic membrane, left ear: Secondary | ICD-10-CM | POA: Diagnosis not present

## 2016-08-12 DIAGNOSIS — J340 Abscess, furuncle and carbuncle of nose: Secondary | ICD-10-CM | POA: Diagnosis not present

## 2016-08-14 ENCOUNTER — Ambulatory Visit
Admission: RE | Admit: 2016-08-14 | Discharge: 2016-08-14 | Disposition: A | Discharge status: Home / Self Care | Payer: PPO | Source: Ambulatory Visit | Attending: Unknown Physician Specialty | Admitting: Unknown Physician Specialty

## 2016-08-14 DIAGNOSIS — Z1231 Encounter for screening mammogram for malignant neoplasm of breast: Secondary | ICD-10-CM | POA: Diagnosis not present

## 2016-08-14 DIAGNOSIS — Z0000 Encounter for general adult medical examination without abnormal findings: Secondary | ICD-10-CM

## 2016-08-14 DIAGNOSIS — J301 Allergic rhinitis due to pollen: Secondary | ICD-10-CM | POA: Diagnosis not present

## 2016-08-15 DIAGNOSIS — J301 Allergic rhinitis due to pollen: Secondary | ICD-10-CM | POA: Diagnosis not present

## 2016-08-21 DIAGNOSIS — J301 Allergic rhinitis due to pollen: Secondary | ICD-10-CM | POA: Diagnosis not present

## 2016-08-28 DIAGNOSIS — J301 Allergic rhinitis due to pollen: Secondary | ICD-10-CM | POA: Diagnosis not present

## 2016-09-04 DIAGNOSIS — J301 Allergic rhinitis due to pollen: Secondary | ICD-10-CM | POA: Diagnosis not present

## 2016-09-11 DIAGNOSIS — J301 Allergic rhinitis due to pollen: Secondary | ICD-10-CM | POA: Diagnosis not present

## 2016-09-25 DIAGNOSIS — J301 Allergic rhinitis due to pollen: Secondary | ICD-10-CM | POA: Diagnosis not present

## 2016-09-30 ENCOUNTER — Ambulatory Visit (INDEPENDENT_AMBULATORY_CARE_PROVIDER_SITE_OTHER): Payer: PPO

## 2016-09-30 DIAGNOSIS — Z23 Encounter for immunization: Secondary | ICD-10-CM

## 2016-10-02 DIAGNOSIS — J301 Allergic rhinitis due to pollen: Secondary | ICD-10-CM | POA: Diagnosis not present

## 2016-10-16 DIAGNOSIS — J301 Allergic rhinitis due to pollen: Secondary | ICD-10-CM | POA: Diagnosis not present

## 2016-10-21 ENCOUNTER — Ambulatory Visit (INDEPENDENT_AMBULATORY_CARE_PROVIDER_SITE_OTHER): Payer: PPO

## 2016-10-21 DIAGNOSIS — Z23 Encounter for immunization: Secondary | ICD-10-CM

## 2016-10-23 DIAGNOSIS — J301 Allergic rhinitis due to pollen: Secondary | ICD-10-CM | POA: Diagnosis not present

## 2016-10-30 DIAGNOSIS — J301 Allergic rhinitis due to pollen: Secondary | ICD-10-CM | POA: Diagnosis not present

## 2016-11-04 ENCOUNTER — Encounter (INDEPENDENT_AMBULATORY_CARE_PROVIDER_SITE_OTHER): Payer: Self-pay | Admitting: Vascular Surgery

## 2016-11-04 ENCOUNTER — Ambulatory Visit (INDEPENDENT_AMBULATORY_CARE_PROVIDER_SITE_OTHER): Payer: PPO | Admitting: Vascular Surgery

## 2016-11-04 VITALS — BP 115/62 | HR 67 | Resp 16 | Ht 67.0 in | Wt 145.0 lb

## 2016-11-04 DIAGNOSIS — M79605 Pain in left leg: Secondary | ICD-10-CM | POA: Diagnosis not present

## 2016-11-04 DIAGNOSIS — M79604 Pain in right leg: Secondary | ICD-10-CM

## 2016-11-04 DIAGNOSIS — I10 Essential (primary) hypertension: Secondary | ICD-10-CM | POA: Diagnosis not present

## 2016-11-04 DIAGNOSIS — M79609 Pain in unspecified limb: Secondary | ICD-10-CM | POA: Insufficient documentation

## 2016-11-04 DIAGNOSIS — E785 Hyperlipidemia, unspecified: Secondary | ICD-10-CM

## 2016-11-04 DIAGNOSIS — I83813 Varicose veins of bilateral lower extremities with pain: Secondary | ICD-10-CM | POA: Diagnosis not present

## 2016-11-04 NOTE — Assessment & Plan Note (Signed)
lipid control important in reducing the progression of atherosclerotic disease.   

## 2016-11-04 NOTE — Progress Notes (Signed)
MRN : [id number]  Hailey Wall is a 70 y.o. (1946/09/14) female who presents with chief complaint of  Chief Complaint  Patient presents with  . Follow-up  .  History of Present Illness: Patient returns in follow up for Leg pain and swelling. She has a previous history of venous insufficiency and has undergone laser ablation of the right great and small saphenous vein as well as sclerotherapy. She has done reasonably well since that time, but had a recent episode of what sounds like fasciitis in her right foot which significantly exacerbated her pain and swelling in the right leg. She has also noticed some increase in terms of discomfort and swelling in the left lower leg over the past several months. She has no ulceration or infection. She has no fever or chills.    Past Medical History:  Diagnosis Date  . Allergy   . Anxiety   . Atrophic vaginitis   . GERD (gastroesophageal reflux disease)   . Hyperlipidemia   . Insomnia   . Menopause   . Osteopenia   . Osteoporosis   . Stress incontinence   . TMJ arthralgia   . Vaginal bleeding   . Varicose vein of leg    Rt leg    Past Surgical History:  Procedure Laterality Date  . CYSTOCELE REPAIR N/A 04/03/2016   Procedure: ANTERIOR REPAIR (CYSTOCELE);  Surgeon: Gae Dry, MD;  Location: [facility] ORS;  Service: Gynecology;  Laterality: N/A;  . DILATION AND CURETTAGE OF UTERUS    . jaw surgery    . two bunions removed Bilateral   . VAGINAL HYSTERECTOMY Bilateral 04/03/2016   Procedure: HYSTERECTOMY VAGINAL/BSO;  Surgeon: Gae Dry, MD;  Location: [facility] ORS;  Service: Gynecology;  Laterality: Bilateral;    Social History Social History  Substance Use Topics  . Smoking status: Former Smoker    Quit date: 03/20/2006  . Smokeless tobacco: Never Used  . Alcohol use No     Family History Family History  Problem Relation Age of Onset  . Heart disease Mother     CHF  . Mental illness Mother   . COPD Mother   .  Emphysema Mother   . Scoliosis Daughter      Current Outpatient Prescriptions  Medication Sig Dispense Refill  . ALPRAZolam (XANAX) 0.5 MG tablet Take 1 tablet (0.5 mg total) by mouth at bedtime as needed for anxiety. 30 tablet 3  . Calcium Citrate-Vitamin D (CALCIUM + D PO) Take 1 tablet by mouth daily. 600mg  powder    . Cholecalciferol (VITAMIN D3) 2000 units TABS Take 1 tablet by mouth daily.    Marland Kitchen CRANBERRY PO Take 800 mg by mouth daily.    . cyanocobalamin 100 MCG tablet Take 100 mcg by mouth daily.    . fexofenadine (ALLEGRA) 180 MG tablet Take 180 mg by mouth as needed for allergies or rhinitis.    . fluticasone (FLOVENT HFA) 44 MCG/ACT inhaler Inhale 2 puffs into the lungs as needed.     . Glucosamine HCl 1000 MG TABS Take by mouth.    Marland Kitchen guaiFENesin (MUCINEX) 600 MG 12 hr tablet Take 600 mg by mouth 2 (two) times daily as needed.    . magnesium oxide (MAG-OX) 400 MG tablet Take 800 mg by mouth daily.     . Multiple Vitamin (MULTIVITAMIN) tablet Take 1 tablet by mouth daily.    . Omega 3-6-9 Fatty Acids (OMEGA 3-6-9 COMPLEX PO) Take 1 tablet by mouth daily.    Marland Kitchen  Probiotic Product (PROBIOTIC DAILY PO) Take by mouth.    . TURMERIC PO Take 1,000 mg by mouth daily.    Marland Kitchen UNABLE TO FIND 1 tablet daily. MUSHROOM IMMUNE FORMULA    . triamcinolone (NASACORT) 55 MCG/ACT AERO nasal inhaler Place 2 sprays into the nose daily.     No current facility-administered medications for this visit.     Allergies  Allergen Reactions  . Cat Hair Extract   . Dust Mite Extract   . Mold Extract [Trichophyton] Other (See Comments)    Sinus trouble   . Other     Dust mite, dust, cockroach eggs  . Tree Extract      REVIEW OF SYSTEMS (Negative unless checked)  Constitutional: [] Weight loss  [] Fever  [] Chills Cardiac: [] Chest pain   [] Chest pressure   [] Palpitations   [] Shortness of breath when laying flat   [] Shortness of breath at rest   [] Shortness of breath with exertion. Vascular:  [] Pain in  legs with walking   [x] Pain in legs at rest   [] Pain in legs when laying flat   [] Claudication   [] Pain in feet when walking  [] Pain in feet at rest  [] Pain in feet when laying flat   [] History of DVT   [] Phlebitis   [x] Swelling in legs   [x] Varicose veins   [] Non-healing ulcers Pulmonary:   [] Uses home oxygen   [] Productive cough   [] Hemoptysis   [] Wheeze  [] COPD    Neurologic:  [] Dizziness  [] Blackouts   [] Seizures   [] History of stroke   [] History of TIA  [] Aphasia   [] Temporary blindness   [] Dysphagia   [] Weakness or numbness in arms   [] Weakness or numbness in legs Musculoskeletal:  [] Arthritis   [] Joint swelling   [] Joint pain   [] Low back pain Hematologic:  [] Easy bruising  [] Easy bleeding   [] Hypercoagulable state   [] Anemic  [] Thrombocytopenia Gastrointestinal:  [] Blood in stool   [] Vomiting blood  [] Gastroesophageal reflux/heartburn   [] Difficulty swallowing. Genitourinary:  [] Chronic kidney disease   [] Difficult urination  [] Frequent urination  [] Burning with urination   [] Blood in urine Skin:  [] Rashes   [] Ulcers   [] Wounds Psychological:  [] History of anxiety   []  History of major depression.  Physical Examination  Vitals:   11/04/16 1145  BP: 115/62  Pulse: 67  Resp: 16  Weight: 145 lb (65.8 kg)  Height: 5\' 7"  (1.702 m)   Body mass index is 22.71 kg/m. Gen:  WD/WN, NAD, appears younger than stated age Head: [place]/AT, No temporalis wasting. Ear/Nose/Throat: Hearing grossly intact, dentition good, trachea midline Eyes: Conjunctiva clear. Sclera non-icteric Neck: Supple.  No JVD. Trachea midline Pulmonary:  Good air movement, respirations not labored, no use of accessory muscles.  Cardiac: RRR, normal S1, S2. Vascular:  Vessel Right Left  Radial Palpable Palpable                                   Gastrointestinal: soft, non-tender/non-distended. No guarding/reflex.  Musculoskeletal: M/S 5/5 throughout.  No deformity or atrophy. Trace BLE edema. Scattered varicosities  bilaterally. Neurologic: Sensation grossly intact in extremities.  Symmetrical.  Speech is fluent. Psychiatric: Judgment intact, Mood & affect appropriate for pt's clinical situation. Dermatologic: No rashes or ulcers noted.  No cellulitis or open wounds. Lymph : No Cervical, Axillary, or Inguinal lymphadenopathy.     Labs No results found for this or any previous visit (from the past 2160 hour(s)).  Radiology No results found.    Assessment/Plan  Hyperlipidemia lipid control important in reducing the progression of atherosclerotic disease.    Pain in limb Although her recent symptoms seem to be driven more by musculoskeletal issues, with her previous history of venous disease I do think is reasonable to assess her for reflux and determine if this is a significant contributing factor. She should continue to wear her compression stockings and elevate her legs. We will see her back following her venous reflux.  Varicose veins of leg with pain, bilateral Although her recent symptoms seem to be driven more by musculoskeletal issues, with her previous history of venous disease I do think is reasonable to assess her for reflux and determine if this is a significant contributing factor. She should continue to wear her compression stockings and elevate her legs. We will see her back following her venous reflux.    Leotis Pain, MD  11/04/2016 1:16 PM    This note was created with Dragon medical transcription system.  Any errors from dictation are purely unintentional

## 2016-11-04 NOTE — Assessment & Plan Note (Signed)
Although her recent symptoms seem to be driven more by musculoskeletal issues, with her previous history of venous disease I do think is reasonable to assess her for reflux and determine if this is a significant contributing factor. She should continue to wear her compression stockings and elevate her legs. We will see her back following her venous reflux.

## 2016-11-06 DIAGNOSIS — J301 Allergic rhinitis due to pollen: Secondary | ICD-10-CM | POA: Diagnosis not present

## 2016-11-11 DIAGNOSIS — N952 Postmenopausal atrophic vaginitis: Secondary | ICD-10-CM | POA: Diagnosis not present

## 2016-11-13 DIAGNOSIS — J301 Allergic rhinitis due to pollen: Secondary | ICD-10-CM | POA: Diagnosis not present

## 2016-11-14 DIAGNOSIS — J301 Allergic rhinitis due to pollen: Secondary | ICD-10-CM | POA: Diagnosis not present

## 2016-11-18 DIAGNOSIS — J301 Allergic rhinitis due to pollen: Secondary | ICD-10-CM | POA: Diagnosis not present

## 2016-11-27 DIAGNOSIS — J301 Allergic rhinitis due to pollen: Secondary | ICD-10-CM | POA: Diagnosis not present

## 2016-12-04 DIAGNOSIS — J301 Allergic rhinitis due to pollen: Secondary | ICD-10-CM | POA: Diagnosis not present

## 2016-12-11 DIAGNOSIS — J301 Allergic rhinitis due to pollen: Secondary | ICD-10-CM | POA: Diagnosis not present

## 2016-12-16 DIAGNOSIS — H6123 Impacted cerumen, bilateral: Secondary | ICD-10-CM | POA: Diagnosis not present

## 2016-12-16 DIAGNOSIS — H7202 Central perforation of tympanic membrane, left ear: Secondary | ICD-10-CM | POA: Diagnosis not present

## 2016-12-16 DIAGNOSIS — J301 Allergic rhinitis due to pollen: Secondary | ICD-10-CM | POA: Diagnosis not present

## 2016-12-25 DIAGNOSIS — J301 Allergic rhinitis due to pollen: Secondary | ICD-10-CM | POA: Diagnosis not present

## 2017-01-06 ENCOUNTER — Ambulatory Visit (INDEPENDENT_AMBULATORY_CARE_PROVIDER_SITE_OTHER): Payer: PPO

## 2017-01-06 ENCOUNTER — Ambulatory Visit (INDEPENDENT_AMBULATORY_CARE_PROVIDER_SITE_OTHER): Payer: PPO | Admitting: Vascular Surgery

## 2017-01-06 ENCOUNTER — Encounter (INDEPENDENT_AMBULATORY_CARE_PROVIDER_SITE_OTHER): Payer: Self-pay | Admitting: Vascular Surgery

## 2017-01-06 VITALS — BP 124/71 | HR 74 | Resp 16 | Ht 67.0 in | Wt 146.0 lb

## 2017-01-06 DIAGNOSIS — M79604 Pain in right leg: Secondary | ICD-10-CM | POA: Diagnosis not present

## 2017-01-06 DIAGNOSIS — M79605 Pain in left leg: Secondary | ICD-10-CM

## 2017-01-06 DIAGNOSIS — I83813 Varicose veins of bilateral lower extremities with pain: Secondary | ICD-10-CM | POA: Diagnosis not present

## 2017-01-06 DIAGNOSIS — I10 Essential (primary) hypertension: Secondary | ICD-10-CM | POA: Diagnosis not present

## 2017-01-06 NOTE — Progress Notes (Signed)
MRN : [id number]  Hailey Wall is a 71 y.o. (October 29, 1946) female who presents with chief complaint of  Chief Complaint  Patient presents with  . Re-evaluation    Ultrasound follow up  .  History of Present Illness: Patient returns today in follow up of her varicose veins. She is doing about the same with no major changes since her last visit. Her venous reflux study today demonstrates no evidence of deep venous thrombosis or superficial thrombophlebitis in either lower extremity. Her right great saphenous vein and small saphenous vein remained ablated without recanalization. There is no reflux present in the left great or small saphenous vein.        Past Medical History:  Diagnosis Date  . Allergy   . Anxiety   . Atrophic vaginitis   . GERD (gastroesophageal reflux disease)   . Hyperlipidemia   . Insomnia   . Menopause   . Osteopenia   . Osteoporosis   . Stress incontinence   . TMJ arthralgia   . Vaginal bleeding   . Varicose vein of leg    Rt leg         Past Surgical History:  Procedure Laterality Date  . CYSTOCELE REPAIR N/A 04/03/2016   Procedure: ANTERIOR REPAIR (CYSTOCELE);  Surgeon: Gae Dry, MD;  Location: [facility];  Service: Gynecology;  Laterality: N/A;  . DILATION AND CURETTAGE OF UTERUS    . jaw surgery    . two bunions removed Bilateral   . VAGINAL HYSTERECTOMY Bilateral 04/03/2016   Procedure: HYSTERECTOMY VAGINAL/BSO;  Surgeon: Gae Dry, MD;  Location: [facility];  Service: Gynecology;  Laterality: Bilateral;    Social History      Social History  Substance Use Topics  . Smoking status: Former Smoker    Quit date: 03/20/2006  . Smokeless tobacco: Never Used  . Alcohol use No     Family History       Family History  Problem Relation Age of Onset  . Heart disease Mother     CHF  . Mental illness Mother   . COPD Mother   . Emphysema Mother   . Scoliosis Daughter              Current Outpatient Prescriptions  Medication Sig Dispense Refill  . ALPRAZolam (XANAX) 0.5 MG tablet Take 1 tablet (0.5 mg total) by mouth at bedtime as needed for anxiety. 30 tablet 3  . Calcium Citrate-Vitamin D (CALCIUM + D PO) Take 1 tablet by mouth daily. 600mg  powder    . Cholecalciferol (VITAMIN D3) 2000 units TABS Take 1 tablet by mouth daily.    Marland Kitchen CRANBERRY PO Take 800 mg by mouth daily.    . cyanocobalamin 100 MCG tablet Take 100 mcg by mouth daily.    . fexofenadine (ALLEGRA) 180 MG tablet Take 180 mg by mouth as needed for allergies or rhinitis.    . fluticasone (FLOVENT HFA) 44 MCG/ACT inhaler Inhale 2 puffs into the lungs as needed.     . Glucosamine HCl 1000 MG TABS Take by mouth.    Marland Kitchen guaiFENesin (MUCINEX) 600 MG 12 hr tablet Take 600 mg by mouth 2 (two) times daily as needed.    . magnesium oxide (MAG-OX) 400 MG tablet Take 800 mg by mouth daily.     . Multiple Vitamin (MULTIVITAMIN) tablet Take 1 tablet by mouth daily.    . Omega 3-6-9 Fatty Acids (OMEGA 3-6-9 COMPLEX PO) Take 1 tablet by mouth daily.    Marland Kitchen  Probiotic Product (PROBIOTIC DAILY PO) Take by mouth.    . TURMERIC PO Take 1,000 mg by mouth daily.    Marland Kitchen UNABLE TO FIND 1 tablet daily. MUSHROOM IMMUNE FORMULA    . triamcinolone (NASACORT) 55 MCG/ACT AERO nasal inhaler Place 2 sprays into the nose daily.     No current facility-administered medications for this visit.          Allergies  Allergen Reactions  . Cat Hair Extract   . Dust Mite Extract   . Mold Extract [Trichophyton] Other (See Comments)    Sinus trouble  . Other     Dust mite, dust, cockroach eggs  . Tree Extract      REVIEW OF SYSTEMS (Negative unless checked)  Constitutional: [] Weight loss  [] Fever  [] Chills Cardiac: [] Chest pain   [] Chest pressure   [] Palpitations   [] Shortness of breath when laying flat   [] Shortness of breath at rest   [] Shortness of breath with exertion. Vascular:  [] Pain in  legs with walking   [x] Pain in legs at rest   [] Pain in legs when laying flat   [] Claudication   [] Pain in feet when walking  [] Pain in feet at rest  [] Pain in feet when laying flat   [] History of DVT   [] Phlebitis   [x] Swelling in legs   [x] Varicose veins   [] Non-healing ulcers Pulmonary:   [] Uses home oxygen   [] Productive cough   [] Hemoptysis   [] Wheeze  [] COPD    Neurologic:  [] Dizziness  [] Blackouts   [] Seizures   [] History of stroke   [] History of TIA  [] Aphasia   [] Temporary blindness   [] Dysphagia   [] Weakness or numbness in arms   [] Weakness or numbness in legs Musculoskeletal:  [] Arthritis   [] Joint swelling   [] Joint pain   [] Low back pain Hematologic:  [] Easy bruising  [] Easy bleeding   [] Hypercoagulable state   [] Anemic  [] Thrombocytopenia Gastrointestinal:  [] Blood in stool   [] Vomiting blood  [] Gastroesophageal reflux/heartburn   [] Difficulty swallowing. Genitourinary:  [] Chronic kidney disease   [] Difficult urination  [] Frequent urination  [] Burning with urination   [] Blood in urine Skin:  [] Rashes   [] Ulcers   [] Wounds Psychological:  [] History of anxiety   []  History of major depression.  Physical Examination     Vitals:   11/04/16 1145  BP: 115/62  Pulse: 67  Resp: 16  Weight: 145 lb (65.8 kg)  Height: 5\' 7"  (1.702 m)   Body mass index is 22.71 kg/m. Gen:  WD/WN, NAD, appears younger than stated age Head: [place]/AT, No temporalis wasting. Ear/Nose/Throat: Hearing grossly intact, dentition good, trachea midline Eyes: Conjunctiva clear. Sclera non-icteric Neck: Supple.  No JVD. Trachea midline Pulmonary:  Good air movement, respirations not labored, no use of accessory muscles.  Cardiac: RRR, normal S1, S2. Vascular:  Vessel Right Left  Radial Palpable Palpable                                   Gastrointestinal: soft, non-tender/non-distended. No guarding/reflex.  Musculoskeletal: M/S 5/5 throughout.  No deformity or atrophy. Trace  BLE edema. Scattered varicosities bilaterally. Neurologic: Sensation grossly intact in extremities.  Symmetrical.  Speech is fluent. Psychiatric: Judgment intact, Mood & affect appropriate for pt's clinical situation. Dermatologic: No rashes or ulcers noted.  No cellulitis or open wounds. Lymph : No Cervical, Axillary, or Inguinal lymphadenopathy.     Labs No results found for this or any  previous visit (from the past 2160 hour(s)).  Radiology Imaging Results     Assessment/Plan  Pain in limb Likely musculoskeletal given the venous duplex findings. Anti-inflammatories, compression stockings, exercise and elevation.  Hypertension blood pressure control important in reducing the progression of atherosclerotic disease. On appropriate oral medications.   Varicose veins of leg with pain, bilateral Her venous reflux study today demonstrates no evidence of deep venous thrombosis or superficial thrombophlebitis in either lower extremity. Her right great saphenous vein and small saphenous vein remained ablated without recanalization. There is no reflux present in the left great or small saphenous vein. Given these findings, she should continue to wear her compression stockings, elevate her legs, and take anti-inflammatories as needed for the discomfort. She is interested in having sclerotherapy for her superficial varicosities. We discussed the risks and benefits of this procedure. This can be done at her convenience.    Leotis Pain, MD  01/07/2017 11:00 AM    This note was created with Dragon medical transcription system.  Any errors from dictation are purely unintentional

## 2017-01-07 NOTE — Assessment & Plan Note (Signed)
blood pressure control important in reducing the progression of atherosclerotic disease. On appropriate oral medications.  

## 2017-01-07 NOTE — Assessment & Plan Note (Signed)
Likely musculoskeletal given the venous duplex findings. Anti-inflammatories, compression stockings, exercise and elevation.

## 2017-01-07 NOTE — Assessment & Plan Note (Signed)
Her venous reflux study today demonstrates no evidence of deep venous thrombosis or superficial thrombophlebitis in either lower extremity. Her right great saphenous vein and small saphenous vein remained ablated without recanalization. There is no reflux present in the left great or small saphenous vein. Given these findings, she should continue to wear her compression stockings, elevate her legs, and take anti-inflammatories as needed for the discomfort. She is interested in having sclerotherapy for her superficial varicosities. We discussed the risks and benefits of this procedure. This can be done at her convenience.

## 2017-01-08 DIAGNOSIS — J301 Allergic rhinitis due to pollen: Secondary | ICD-10-CM | POA: Diagnosis not present

## 2017-01-22 DIAGNOSIS — J301 Allergic rhinitis due to pollen: Secondary | ICD-10-CM | POA: Diagnosis not present

## 2017-02-05 DIAGNOSIS — J301 Allergic rhinitis due to pollen: Secondary | ICD-10-CM | POA: Diagnosis not present

## 2017-02-10 ENCOUNTER — Encounter: Payer: Self-pay | Admitting: Unknown Physician Specialty

## 2017-02-10 ENCOUNTER — Ambulatory Visit (INDEPENDENT_AMBULATORY_CARE_PROVIDER_SITE_OTHER): Payer: PPO | Admitting: Unknown Physician Specialty

## 2017-02-10 ENCOUNTER — Encounter (INDEPENDENT_AMBULATORY_CARE_PROVIDER_SITE_OTHER): Payer: Self-pay

## 2017-02-10 DIAGNOSIS — F419 Anxiety disorder, unspecified: Secondary | ICD-10-CM | POA: Diagnosis not present

## 2017-02-10 DIAGNOSIS — E781 Pure hyperglyceridemia: Secondary | ICD-10-CM | POA: Diagnosis not present

## 2017-02-10 MED ORDER — ALPRAZOLAM 0.5 MG PO TABS: 0.5000 mg | ORAL_TABLET | Freq: Every evening | ORAL | 3 refills | Status: DC | PRN

## 2017-02-10 NOTE — Assessment & Plan Note (Signed)
Discussed getting Triglycerides down

## 2017-02-10 NOTE — Assessment & Plan Note (Signed)
Continue with small amounts of Alprazolam

## 2017-02-10 NOTE — Progress Notes (Signed)
   BP 116/72 (BP Location: Left Arm, Patient Position: Sitting, Cuff Size: Normal)   Pulse 74   Temp 97.6 F (36.4 C)   Ht 5' 5.9" (1.674 m)   Wt 144 lb 1.6 oz (65.4 kg)   LMP  (LMP Unknown)   SpO2 99%   BMI 23.33 kg/m    Subjective:    Patient ID: Hailey Wall, female    DOB: [birth year], 71 y.o.   MRN: [id number]  HPI: [name] is a 71 y.o. female  Chief Complaint  Patient presents with  . Anxiety    pt states she needs a refill on xanax   Anxiety Pt states she has anxiety that "overwhelms me" which is worse recently with family problems.   States she sometimes has to go over 1/2/day.    Hyperlipidemia Discussed with pt about her high Triglycerides.    Relevant past medical, surgical, family and social history reviewed and updated as indicated. Interim medical history since our last visit reviewed. Allergies and medications reviewed and updated.  Review of Systems  Per HPI unless specifically indicated above     Objective:    BP 116/72 (BP Location: Left Arm, Patient Position: Sitting, Cuff Size: Normal)   Pulse 74   Temp 97.6 F (36.4 C)   Ht 5' 5.9" (1.674 m)   Wt 144 lb 1.6 oz (65.4 kg)   LMP  (LMP Unknown)   SpO2 99%   BMI 23.33 kg/m   Wt Readings from Last 3 Encounters:  02/10/17 144 lb 1.6 oz (65.4 kg)  01/06/17 146 lb (66.2 kg)  11/04/16 145 lb (65.8 kg)    Physical Exam  Constitutional: She is oriented to person, place, and time. She appears well-developed and well-nourished. No distress.  HENT:  Head: Normocephalic and atraumatic.  Eyes: Conjunctivae and lids are normal. Right eye exhibits no discharge. Left eye exhibits no discharge. No scleral icterus.  Neck: Normal range of motion. Neck supple. No JVD present. Carotid bruit is not present.  Cardiovascular: Normal rate, regular rhythm and normal heart sounds.   Pulmonary/Chest: Effort normal and breath sounds normal.  Abdominal: Normal appearance. There is no splenomegaly or  hepatomegaly.  Musculoskeletal: Normal range of motion.  Neurological: She is alert and oriented to person, place, and time.  Skin: Skin is warm, dry and intact. No rash noted. No pallor.  Psychiatric: She has a normal mood and affect. Her behavior is normal. Judgment and thought content normal.      Assessment & Plan:   Problem List Items Addressed This Visit      Unprioritized   Chronic anxiety    Continue with small amounts of Alprazolam      Relevant Medications   ALPRAZolam (XANAX) 0.5 MG tablet   Hyperlipidemia    Discussed getting Triglycerides down          Follow up plan: Return in about 6 months (around 08/10/2017) for physical.

## 2017-02-11 DIAGNOSIS — J301 Allergic rhinitis due to pollen: Secondary | ICD-10-CM | POA: Diagnosis not present

## 2017-02-12 ENCOUNTER — Encounter: Payer: Self-pay | Admitting: Family Medicine

## 2017-02-12 ENCOUNTER — Ambulatory Visit (INDEPENDENT_AMBULATORY_CARE_PROVIDER_SITE_OTHER): Payer: PPO | Admitting: Family Medicine

## 2017-02-12 VITALS — BP 117/73 | HR 90 | Temp 98.3°F | Wt 143.0 lb

## 2017-02-12 DIAGNOSIS — J111 Influenza due to unidentified influenza virus with other respiratory manifestations: Secondary | ICD-10-CM | POA: Diagnosis not present

## 2017-02-12 DIAGNOSIS — R6889 Other general symptoms and signs: Secondary | ICD-10-CM | POA: Diagnosis not present

## 2017-02-12 LAB — VERITOR FLU A/B WAIVED
INFLUENZA B: NEGATIVE
Influenza A: NEGATIVE

## 2017-02-12 MED ORDER — OSELTAMIVIR PHOSPHATE 75 MG PO CAPS: 75.0000 mg | ORAL_CAPSULE | Freq: Two times a day (BID) | ORAL | 0 refills | Status: DC

## 2017-02-12 MED ORDER — DOXYCYCLINE MONOHYDRATE 100 MG PO CAPS: 100.0000 mg | ORAL_CAPSULE | Freq: Two times a day (BID) | ORAL | 0 refills | Status: DC

## 2017-02-12 NOTE — Progress Notes (Signed)
BP 117/73   Pulse 90   Temp 98.3 F (36.8 C)   Wt 143 lb (64.9 kg)   LMP  (LMP Unknown)   SpO2 100%   BMI 23.15 kg/m    Subjective:    Patient ID: Hailey Wall, female    DOB: 04/16/1946, 71 y.o.   MRN: [id number]  HPI: Hailey Wall is a 71 y.o. female  Chief Complaint  Patient presents with  . URI    x 1 day, fever, left ear ache, body aches, non productive cough, h/a , sore throat, nasal congestion.   Patient presents with sudden onset sinus pain, pressure, ear pain, cough, sinus HA, sore throat, congestion. Had a fever this morning of 101 and severe body aches and chills. Denies CP, SOB, wheezing. Taking herbal flu remedies, saline nasal drops. Grandchildren have been sick and she has been with them over the weekend.  States she has severe allergy and sinus issues for which she sees ENT every 4 months. Has these terrible sinus infections about 3 times annually and the only medication that works is doxycycline monohydrate   Past Medical History:  Diagnosis Date  . Allergy   . Anxiety   . Atrophic vaginitis   . GERD (gastroesophageal reflux disease)   . Hyperlipidemia   . Insomnia   . Menopause   . Osteopenia   . Osteoporosis   . Stress incontinence   . TMJ arthralgia   . Vaginal bleeding   . Varicose vein of leg    Rt leg   Social History   Social History  . Marital status: Married    Spouse name: N/A  . Number of children: N/A  . Years of education: N/A   Occupational History  . Not on file.   Social History Main Topics  . Smoking status: Former Smoker    Quit date: 03/20/1997  . Smokeless tobacco: Never Used  . Alcohol use No  . Drug use: No  . Sexual activity: No   Other Topics Concern  . Not on file   Social History Narrative  . No narrative on file    Relevant past medical, surgical, family and social history reviewed and updated as indicated. Interim medical history since our last visit reviewed. Allergies and medications  reviewed and updated.  Review of Systems  Constitutional: Positive for chills, fatigue and fever.  HENT: Positive for congestion, ear pain, rhinorrhea, sinus pain, sinus pressure and sore throat.   Eyes: Negative.   Respiratory: Positive for cough.   Cardiovascular: Negative.   Gastrointestinal: Positive for nausea.  Genitourinary: Negative.   Musculoskeletal: Positive for myalgias.  Neurological: Positive for headaches.  Psychiatric/Behavioral: Negative.     Per HPI unless specifically indicated above     Objective:    BP 117/73   Pulse 90   Temp 98.3 F (36.8 C)   Wt 143 lb (64.9 kg)   LMP  (LMP Unknown)   SpO2 100%   BMI 23.15 kg/m   Wt Readings from Last 3 Encounters:  02/12/17 143 lb (64.9 kg)  02/10/17 144 lb 1.6 oz (65.4 kg)  01/06/17 146 lb (66.2 kg)    Physical Exam  Constitutional: She is oriented to person, place, and time. She appears well-developed and well-nourished. No distress.  HENT:  Head: Atraumatic.  Right Ear: External ear normal.  Left Ear: External ear normal.  Nasal mucosa injected Oropharynx erythematous  Eyes: Conjunctivae are normal. Pupils are equal, round, and reactive to light. No  scleral icterus.  Neck: Normal range of motion. Neck supple.  Cardiovascular: Normal rate and normal heart sounds.   Pulmonary/Chest: Effort normal and breath sounds normal. No respiratory distress.  Musculoskeletal: Normal range of motion.  Lymphadenopathy:    She has no cervical adenopathy.  Neurological: She is alert and oriented to person, place, and time.  Skin: Skin is warm and dry.  Psychiatric: She has a normal mood and affect.  Nursing note and vitals reviewed.     Assessment & Plan:   Problem List Items Addressed This Visit    None    Visit Diagnoses    Influenza    -  Primary   Rapid flu negative, but classic sxs. Will start tamiflu. supportive care discussed. Follow up if worsening or no improvement.    Relevant Medications    oseltamivir (TAMIFLU) 75 MG capsule   Other Relevant Orders   Influenza A & B (STAT)    Will send doxycycline in case sinus issues become worse - states her ENT gives her this several times a year for these symptoms and they could not see her this week.    Follow up plan: Return if symptoms worsen or fail to improve.

## 2017-02-12 NOTE — Patient Instructions (Signed)
Follow up as needed

## 2017-02-19 DIAGNOSIS — J301 Allergic rhinitis due to pollen: Secondary | ICD-10-CM | POA: Diagnosis not present

## 2017-02-26 DIAGNOSIS — J301 Allergic rhinitis due to pollen: Secondary | ICD-10-CM | POA: Diagnosis not present

## 2017-03-02 ENCOUNTER — Encounter: Payer: Self-pay | Admitting: Family Medicine

## 2017-03-05 DIAGNOSIS — J301 Allergic rhinitis due to pollen: Secondary | ICD-10-CM | POA: Diagnosis not present

## 2017-03-19 DIAGNOSIS — J301 Allergic rhinitis due to pollen: Secondary | ICD-10-CM | POA: Diagnosis not present

## 2017-04-02 DIAGNOSIS — J301 Allergic rhinitis due to pollen: Secondary | ICD-10-CM | POA: Diagnosis not present

## 2017-04-16 DIAGNOSIS — J301 Allergic rhinitis due to pollen: Secondary | ICD-10-CM | POA: Diagnosis not present

## 2017-04-21 DIAGNOSIS — K219 Gastro-esophageal reflux disease without esophagitis: Secondary | ICD-10-CM | POA: Diagnosis not present

## 2017-04-21 DIAGNOSIS — J301 Allergic rhinitis due to pollen: Secondary | ICD-10-CM | POA: Diagnosis not present

## 2017-04-21 DIAGNOSIS — H7202 Central perforation of tympanic membrane, left ear: Secondary | ICD-10-CM | POA: Diagnosis not present

## 2017-04-21 DIAGNOSIS — H6123 Impacted cerumen, bilateral: Secondary | ICD-10-CM | POA: Diagnosis not present

## 2017-04-30 DIAGNOSIS — J301 Allergic rhinitis due to pollen: Secondary | ICD-10-CM | POA: Diagnosis not present

## 2017-05-05 DIAGNOSIS — J301 Allergic rhinitis due to pollen: Secondary | ICD-10-CM | POA: Diagnosis not present

## 2017-05-14 DIAGNOSIS — J301 Allergic rhinitis due to pollen: Secondary | ICD-10-CM | POA: Diagnosis not present

## 2017-05-19 ENCOUNTER — Encounter: Payer: Self-pay | Admitting: Obstetrics & Gynecology

## 2017-05-19 ENCOUNTER — Ambulatory Visit (INDEPENDENT_AMBULATORY_CARE_PROVIDER_SITE_OTHER): Payer: PPO | Admitting: Obstetrics & Gynecology

## 2017-05-19 VITALS — BP 120/80 | HR 97 | Ht 67.0 in | Wt 140.0 lb

## 2017-05-19 DIAGNOSIS — Z0000 Encounter for general adult medical examination without abnormal findings: Secondary | ICD-10-CM

## 2017-05-19 DIAGNOSIS — Z01419 Encounter for gynecological examination (general) (routine) without abnormal findings: Secondary | ICD-10-CM

## 2017-05-19 DIAGNOSIS — N952 Postmenopausal atrophic vaginitis: Secondary | ICD-10-CM | POA: Diagnosis not present

## 2017-05-19 MED ORDER — ESTRIOL 10 % CREA: 1.5000 g | TOPICAL_CREAM | 12 refills | Status: DC

## 2017-05-19 NOTE — Patient Instructions (Addendum)
PAP every 5 years Mammogram every year Colonoscopy or Cologuard every 10 years Labs yearly (with PCP)   Atrophic Vaginitis Atrophic vaginitis is when the tissues that line the vagina become dry and thin. This is caused by a drop in estrogen. Estrogen helps:  To keep the vagina moist.  To make a clear fluid that helps:  To lubricate the vagina for sex.  To protect the vagina from infection. If the lining of the vagina is dry and thin, it may:  Make sex painful. It may also cause bleeding.  Cause a feeling of:  Burning.  Irritation.  Itchiness.  Make an exam of your vagina painful. It may also cause bleeding.  Make you lose interest in sex.  Cause a burning feeling when you pee.  Make your vaginal fluid (discharge) brown or yellow. For some women, there are no symptoms. This condition is most common in women who do not get their regular menstrual periods anymore (menopause). This often starts when a woman is 68-40 years old. Follow these instructions at home:  Take medicines only as told by your doctor. Do not use any herbal or alternative medicines unless your doctor says it is okay.  Use over-the-counter products for dryness only as told by your doctor. These include:  Creams.  Lubricants.  Moisturizers.  Do not douche.  Do not use products that can make your vagina dry. These include:  Scented feminine sprays.  Scented tampons.  Scented soaps.  If it hurts to have sex, tell your sexual partner. Contact a doctor if:  Your discharge looks different than normal.  Your vagina has an unusual smell.  You have new symptoms.  Your symptoms do not get better with treatment.  Your symptoms get worse. This information is not intended to replace advice given to you by your health care provider. Make sure you discuss any questions you have with your health care provider. Document Released: 06/02/2008 Document Revised: 05/22/2016 Document Reviewed:  12/06/2014 Elsevier Interactive Patient Education  2017 [employer].

## 2017-05-19 NOTE — Progress Notes (Signed)
HPI:      [name] is a 71 y.o. G3P2003 who LMP was in the past, she presents today for her annual examination.  The patient has no complaints today. The patient is not sexually active. Herlast pap: normal prior to hysterectomy for endometrial hyperplasia last year and last mammogram: was normal.  The patient does perform self breast exams.  There is no notable family history of breast or ovarian cancer in her family. The patient is taking VAGINAL ESTRIOL for atrophy w some relief as her hormone replacement therapy. Patient denies post-menopausal vaginal bleeding.   The patient has regular exercise: yes. The patient denies current symptoms of depression.    GYN Hx: Last Colonoscopy:never ago. Last DEXA: never ago.    PMHx: Past Medical History:  Diagnosis Date  . Allergy   . Anxiety   . Atrophic vaginitis   . GERD (gastroesophageal reflux disease)   . Hyperlipidemia   . Insomnia   . Menopause   . Osteopenia   . Osteoporosis   . Stress incontinence   . TMJ arthralgia   . Vaginal bleeding   . Varicose vein of leg    Rt leg   Past Surgical History:  Procedure Laterality Date  . CYSTOCELE REPAIR N/A 04/03/2016   Procedure: ANTERIOR REPAIR (CYSTOCELE);  Surgeon: Gae Dry, MD;  Location: [facility] ORS;  Service: Gynecology;  Laterality: N/A;  . DILATION AND CURETTAGE OF UTERUS    . jaw surgery    . two bunions removed Bilateral   . VAGINAL HYSTERECTOMY Bilateral 04/03/2016   Procedure: HYSTERECTOMY VAGINAL/BSO;  Surgeon: Gae Dry, MD;  Location: [facility] ORS;  Service: Gynecology;  Laterality: Bilateral;   Family History  Problem Relation Age of Onset  . Heart disease Mother        CHF  . Mental illness Mother   . COPD Mother   . Emphysema Mother   . Scoliosis Daughter    Social History  Substance Use Topics  . Smoking status: Former Smoker    Quit date: 03/20/1997  . Smokeless tobacco: Never Used  . Alcohol use No    Current Outpatient Prescriptions:  .   ALPRAZolam (XANAX) 0.5 MG tablet, Take 1 tablet (0.5 mg total) by mouth at bedtime as needed for anxiety., Disp: 30 tablet, Rfl: 3 .  Calcium Citrate-Vitamin D (CALCIUM + D PO), Take 1 tablet by mouth daily. 600mg  powder, Disp: , Rfl:  .  Cholecalciferol (VITAMIN D3) 2000 units TABS, Take 1 tablet by mouth daily., Disp: , Rfl:  .  CIPRODEX otic suspension, APPLY FOUR DROPS TO LEFT EAR TWICE DAILY FOR 7 DAYS, Disp: , Rfl: 6 .  CRANBERRY PO, Take 800 mg by mouth daily., Disp: , Rfl:  .  cyanocobalamin 100 MCG tablet, Take 100 mcg by mouth daily., Disp: , Rfl:  .  doxycycline (MONODOX) 100 MG capsule, Take 1 capsule (100 mg total) by mouth 2 (two) times daily., Disp: 20 capsule, Rfl: 0 .  doxycycline (VIBRAMYCIN) 100 MG capsule, , Disp: , Rfl:  .  DYMISTA 137-50 MCG/ACT SUSP, , Disp: , Rfl:  .  [START ON 05/20/2017] Estriol 10 % CREA, Place 1.5 g vaginally 3 (three) times a week., Disp: 45 g, Rfl: 12 .  fexofenadine (ALLEGRA) 180 MG tablet, Take 180 mg by mouth as needed for allergies or rhinitis., Disp: , Rfl:  .  fluticasone (FLOVENT HFA) 44 MCG/ACT inhaler, Inhale 2 puffs into the lungs as needed. , Disp: , Rfl:  .  Glucosamine HCl 1000 MG TABS, Take by mouth., Disp: , Rfl:  .  guaiFENesin (MUCINEX) 600 MG 12 hr tablet, Take 600 mg by mouth 2 (two) times daily as needed., Disp: , Rfl:  .  magnesium oxide (MAG-OX) 400 MG tablet, Take 800 mg by mouth daily. , Disp: , Rfl:  .  Multiple Vitamin (MULTIVITAMIN) tablet, Take 1 tablet by mouth daily., Disp: , Rfl:  .  ofloxacin (FLOXIN) 0.3 % otic solution, PLACE 4 DROPS IN EFFECTED EAR TWICE A DAY X 7 DAYS, Disp: , Rfl: 0 .  Omega 3-6-9 Fatty Acids (OMEGA 3-6-9 COMPLEX PO), Take 1 tablet by mouth daily., Disp: , Rfl:  .  oseltamivir (TAMIFLU) 75 MG capsule, Take 1 capsule (75 mg total) by mouth 2 (two) times daily., Disp: 10 capsule, Rfl: 0 .  Probiotic Product (PROBIOTIC DAILY PO), Take by mouth., Disp: , Rfl:  .  Pyridoxine HCl (VITAMIN B6 PO), Take 1  tablet by mouth daily., Disp: , Rfl:  .  Thiamine Mononitrate (VITAMIN B1 PO), Take 1 tablet by mouth daily., Disp: , Rfl:  .  triamcinolone (NASACORT) 55 MCG/ACT AERO nasal inhaler, Place 2 sprays into the nose daily., Disp: , Rfl:  .  TURMERIC PO, Take 1,000 mg by mouth daily., Disp: , Rfl:  .  UNABLE TO FIND, 1 tablet daily. MUSHROOM IMMUNE FORMULA, Disp: , Rfl:  .  UNABLE TO FIND, Estroil Hormone Replacement (BioTE)- Bioidentical cream, Disp: , Rfl:  Allergies: Cat hair extract; Dust mite extract; Mold extract [trichophyton]; Other; and Tree extract  Review of Systems  Constitutional: Negative for chills, fever and malaise/fatigue.  HENT: Negative for congestion, sinus pain and sore throat.   Eyes: Negative for blurred vision and pain.  Respiratory: Negative for cough and wheezing.   Cardiovascular: Negative for chest pain and leg swelling.  Gastrointestinal: Negative for abdominal pain, constipation, diarrhea, heartburn, nausea and vomiting.  Genitourinary: Negative for dysuria, frequency, hematuria and urgency.  Musculoskeletal: Negative for back pain, joint pain, myalgias and neck pain.  Skin: Negative for itching and rash.  Neurological: Negative for dizziness, tremors and weakness.  Endo/Heme/Allergies: Does not bruise/bleed easily.  Psychiatric/Behavioral: Negative for depression. The patient is not nervous/anxious and does not have insomnia.     Objective: BP 120/80   Pulse 97   Ht 5\' 7"  (1.702 m)   Wt 140 lb (63.5 kg)   LMP  (LMP Unknown)   BMI 21.93 kg/m   Filed Weights   05/19/17 1323  Weight: 140 lb (63.5 kg)   Body mass index is 21.93 kg/m. Physical Exam  Constitutional: She is oriented to person, place, and time. She appears well-developed and well-nourished. No distress.  Genitourinary: Rectum normal and vagina normal. Pelvic exam was performed with patient supine. There is no rash or lesion on the right labia. There is no rash or lesion on the left labia.  Vagina exhibits no lesion. No bleeding in the vagina. Right adnexum does not display mass and does not display tenderness. Left adnexum does not display mass and does not display tenderness.  Genitourinary Comments: Absent Uterus Absent cervix Vaginal cuff well healed Atrophy noted  HENT:  Head: Normocephalic and atraumatic. Head is without laceration.  Right Ear: Hearing normal.  Left Ear: Hearing normal.  Nose: No epistaxis.  No foreign bodies.  Mouth/Throat: Uvula is midline, oropharynx is clear and moist and mucous membranes are normal.  Eyes: Pupils are equal, round, and reactive to light.  Neck: Normal range of motion. Neck  supple. No thyromegaly present.  Cardiovascular: Normal rate and regular rhythm.  Exam reveals no gallop and no friction rub.   No murmur heard. Pulmonary/Chest: Effort normal and breath sounds normal. No respiratory distress. She has no wheezes. Right breast exhibits no mass, no skin change and no tenderness. Left breast exhibits no mass, no skin change and no tenderness.  Abdominal: Soft. Bowel sounds are normal. She exhibits no distension. There is no tenderness. There is no rebound.  Musculoskeletal: Normal range of motion.  Neurological: She is alert and oriented to person, place, and time. No cranial nerve deficit.  Skin: Skin is warm and dry.  Psychiatric: She has a normal mood and affect. Judgment normal.  Vitals reviewed.   Assessment: Annual Exam 1. Annual physical exam   2. Vaginal atrophy     Plan:            1.  Cervical Screening-  Pap smear schedule reviewed with patient  2. Breast screening- Exam annually and mammogram scheduled  3. Colonoscopy every 10 years, Hemoccult testing after age 70  4. Labs managed by PCP  5. Counseling for hormonal therapy: wants to change HRT or dose due to incomplete benefit on vaginal health     F/U  Return in about 1 year (around 05/19/2018) for Annual.  Barnett Applebaum, MD, [facility], [facility] 05/19/2017  1:50 PM

## 2017-05-20 ENCOUNTER — Telehealth: Payer: Self-pay

## 2017-05-20 NOTE — Telephone Encounter (Signed)
Pharmacy called stating that the cream is a stronger dose than normal for Pt, Is this correct or is pt to be in usual dosage as before? Please advise

## 2017-05-20 NOTE — Telephone Encounter (Signed)
New dose and amount as newly prescribed.  Let them know.

## 2017-05-20 NOTE — Telephone Encounter (Signed)
The strength is 10 % they pharmacy still has question because its way stronger than her old rx please advise, sorry

## 2017-05-20 NOTE — Telephone Encounter (Signed)
Estriol 10% is only dose/strength I see listed in the PDR and what she appeared to be on before.  If they have compounded at a different concentration after the fact, then I would need to know what that is.  My plan was to increase to 1.5 g three times weekly but at same 'concentration' as before, when she was taking 1 g twice a week.  Hope that makes sense to them.

## 2017-05-20 NOTE — Telephone Encounter (Signed)
pharmacist states the rx from 01/07/17 is Estriol compounded vaginal cream 0.1mg  (apply 1 gram twice weekly per vagina) she  states the 10% is way stronger than the 0.1mg , pharmacist name is Merichelle at [place] if you would like to call her.

## 2017-05-21 DIAGNOSIS — J301 Allergic rhinitis due to pollen: Secondary | ICD-10-CM | POA: Diagnosis not present

## 2017-05-28 DIAGNOSIS — J301 Allergic rhinitis due to pollen: Secondary | ICD-10-CM | POA: Diagnosis not present

## 2017-05-29 ENCOUNTER — Telehealth: Payer: Self-pay | Admitting: Unknown Physician Specialty

## 2017-05-29 NOTE — Telephone Encounter (Signed)
Called pt to schedule Annual Wellness Visit with Nurse Health Advisor for July:  - knb

## 2017-06-04 ENCOUNTER — Telehealth: Payer: Self-pay

## 2017-06-04 DIAGNOSIS — J301 Allergic rhinitis due to pollen: Secondary | ICD-10-CM | POA: Diagnosis not present

## 2017-06-04 NOTE — Telephone Encounter (Signed)
Pt calling states she needs to have her dose of estriol increased as per Randi, compound pharm, who states she is on a low dose of 0.1mg /gm.  Suggested higher dose is 1mg /gm and 1gm 3xwk.  Pt states you can call Randi if need be.  Please call pt.  [phone]

## 2017-06-04 NOTE — Telephone Encounter (Signed)
Pt called back to say it's okay to leave a msg. [phone]

## 2017-06-05 ENCOUNTER — Encounter: Payer: Self-pay | Admitting: Obstetrics & Gynecology

## 2017-06-05 NOTE — Telephone Encounter (Signed)
Please call pharmacy and make change as above.  I sent message to pateint already. Dose is 1mg /g at 3 times weekly.

## 2017-06-18 DIAGNOSIS — J301 Allergic rhinitis due to pollen: Secondary | ICD-10-CM | POA: Diagnosis not present

## 2017-07-02 DIAGNOSIS — J301 Allergic rhinitis due to pollen: Secondary | ICD-10-CM | POA: Diagnosis not present

## 2017-07-06 ENCOUNTER — Ambulatory Visit (INDEPENDENT_AMBULATORY_CARE_PROVIDER_SITE_OTHER): Payer: PPO | Admitting: Family Medicine

## 2017-07-06 ENCOUNTER — Encounter: Payer: Self-pay | Admitting: Family Medicine

## 2017-07-06 ENCOUNTER — Telehealth: Payer: Self-pay | Admitting: Unknown Physician Specialty

## 2017-07-06 VITALS — BP 123/74 | HR 75 | Temp 97.8°F | Ht 66.5 in | Wt 142.0 lb

## 2017-07-06 DIAGNOSIS — Z7189 Other specified counseling: Secondary | ICD-10-CM

## 2017-07-06 DIAGNOSIS — F419 Anxiety disorder, unspecified: Secondary | ICD-10-CM | POA: Diagnosis not present

## 2017-07-06 DIAGNOSIS — Z0000 Encounter for general adult medical examination without abnormal findings: Secondary | ICD-10-CM

## 2017-07-06 DIAGNOSIS — E781 Pure hyperglyceridemia: Secondary | ICD-10-CM

## 2017-07-06 DIAGNOSIS — I10 Essential (primary) hypertension: Secondary | ICD-10-CM

## 2017-07-06 DIAGNOSIS — Z1231 Encounter for screening mammogram for malignant neoplasm of breast: Secondary | ICD-10-CM

## 2017-07-06 LAB — UA/M W/RFLX CULTURE, ROUTINE
Bilirubin, UA: NEGATIVE
Glucose, UA: NEGATIVE
Ketones, UA: NEGATIVE
LEUKOCYTES UA: NEGATIVE
NITRITE UA: NEGATIVE
PH UA: 5.5 (ref 5.0–7.5)
PROTEIN UA: NEGATIVE
RBC UA: NEGATIVE
SPEC GRAV UA: 1.025 (ref 1.005–1.030)
Urobilinogen, Ur: 0.2 mg/dL (ref 0.2–1.0)

## 2017-07-06 MED ORDER — ALPRAZOLAM 0.5 MG PO TABS
0.5000 mg | ORAL_TABLET | Freq: Every evening | ORAL | 3 refills | Status: DC | PRN
Start: 2017-07-06 — End: 2017-12-03

## 2017-07-06 NOTE — Assessment & Plan Note (Signed)
Packet given, long discussion regarding importance of considering these types of things long before anything may be to happen. Discussed types of situations where this information would come into play. Pt somewhat resistant at this time but agrees to look over the information packet provided to start making some decisions. Will check back in at next appt for any updates.

## 2017-07-06 NOTE — Assessment & Plan Note (Signed)
Has started eating more flax seeds and taking a fish oil supplement in attempt to reduce her TGs. Will recheck today and adjust as needed. Continue lifestyle modifications.

## 2017-07-06 NOTE — Assessment & Plan Note (Signed)
Stable and WNL with good diet control and exercise. Continue these practices, monitor as able and call the office with persistent abnormal readings

## 2017-07-06 NOTE — Progress Notes (Signed)
BP 123/74   Pulse 75   Temp 97.8 F (36.6 C)   Ht 5' 6.5" (1.689 m)   Wt 142 lb (64.4 kg)   LMP  (LMP Unknown)   SpO2 100%   BMI 22.58 kg/m    Subjective:    Patient ID: Hailey Wall, female    DOB: 05-19-1946, 71 y.o.   MRN: [id number]  HPI: Hailey Wall is a 71 y.o. female presenting on 07/06/2017 for comprehensive medical examination. Current medical complaints include:see below  Going back from 1/2 tab to 3/4 tab lately due to some family issues. Working with the pastor at her church which has helped some.   Seeing ENT next month for chronic sinus issues. Currently on cipro drops from them for some right ear issues.   Menopausal Symptoms: yes, on vaginal estrogen  Functional Status Survey: Is the patient deaf or have difficulty hearing?: Yes (wears hearing aid in left ear) Does the patient have difficulty seeing, even when wearing glasses/contacts?: No Does the patient have difficulty concentrating, remembering, or making decisions?: No Does the patient have difficulty walking or climbing stairs?: No Does the patient have difficulty dressing or bathing?: No Does the patient have difficulty doing errands alone such as visiting a doctor's office or shopping?: No  Fall Risk  07/06/2017 07/08/2016  Falls in the past year? Yes No  Number falls in past yr: 1 -  Injury with Fall? No -    Depression Screen Depression screen [facility] 2/9 07/06/2017 07/08/2016  Decreased Interest 0 0  Down, Depressed, Hopeless 0 0  PHQ - 2 Score 0 0   Advanced Directives Does patient have a HCPOA?    no If yes, name and contact information:  Does patient have a living will or MOST form?  no  Past Medical History:  Past Medical History:  Diagnosis Date  . Allergy   . Anxiety   . Atrophic vaginitis   . GERD (gastroesophageal reflux disease)   . Hyperlipidemia   . Insomnia   . Menopause   . Osteopenia   . Osteoporosis   . Stress incontinence   . TMJ arthralgia   . Vaginal  bleeding   . Varicose vein of leg    Rt leg    Surgical History:  Past Surgical History:  Procedure Laterality Date  . CYSTOCELE REPAIR N/A 04/03/2016   Procedure: ANTERIOR REPAIR (CYSTOCELE);  Surgeon: Gae Dry, MD;  Location: [facility] ORS;  Service: Gynecology;  Laterality: N/A;  . DILATION AND CURETTAGE OF UTERUS    . jaw surgery    . two bunions removed Bilateral   . VAGINAL HYSTERECTOMY Bilateral 04/03/2016   Procedure: HYSTERECTOMY VAGINAL/BSO;  Surgeon: Gae Dry, MD;  Location: [facility] ORS;  Service: Gynecology;  Laterality: Bilateral;    Medications:  Current Outpatient Prescriptions on File Prior to Visit  Medication Sig  . Calcium Citrate-Vitamin D (CALCIUM + D PO) Take 1 tablet by mouth daily. 600mg  powder  . Cholecalciferol (VITAMIN D3) 2000 units TABS Take 1 tablet by mouth daily.  Marland Kitchen CIPRODEX otic suspension APPLY FOUR DROPS TO LEFT EAR TWICE DAILY FOR 7 DAYS  . CRANBERRY PO Take 800 mg by mouth daily.  . cyanocobalamin 100 MCG tablet Take 100 mcg by mouth daily.  Marland Kitchen DYMISTA 137-50 MCG/ACT SUSP   . Estriol 10 % CREA Place 1.5 g vaginally 3 (three) times a week.  . fexofenadine (ALLEGRA) 180 MG tablet Take 180 mg by mouth as needed for allergies  or rhinitis.  . fluticasone (FLOVENT HFA) 44 MCG/ACT inhaler Inhale 2 puffs into the lungs as needed.   . Glucosamine HCl 1000 MG TABS Take by mouth.  Marland Kitchen guaiFENesin (MUCINEX) 600 MG 12 hr tablet Take 600 mg by mouth 2 (two) times daily as needed.  . magnesium oxide (MAG-OX) 400 MG tablet Take 800 mg by mouth daily.   . Multiple Vitamin (MULTIVITAMIN) tablet Take 1 tablet by mouth daily.  Marland Kitchen ofloxacin (FLOXIN) 0.3 % otic solution PLACE 4 DROPS IN EFFECTED EAR TWICE A DAY X 7 DAYS  . Omega 3-6-9 Fatty Acids (OMEGA 3-6-9 COMPLEX PO) Take 1 tablet by mouth daily.  . Probiotic Product (PROBIOTIC DAILY PO) Take by mouth.  . Pyridoxine HCl (VITAMIN B6 PO) Take 1 tablet by mouth daily.  . Thiamine Mononitrate (VITAMIN B1 PO) Take 1  tablet by mouth daily.  Marland Kitchen triamcinolone (NASACORT) 55 MCG/ACT AERO nasal inhaler Place 2 sprays into the nose daily.  . TURMERIC PO Take 1,000 mg by mouth daily.   No current facility-administered medications on file prior to visit.     Allergies:  Allergies  Allergen Reactions  . Cat Hair Extract   . Dust Mite Extract   . Mold Extract [Trichophyton] Other (See Comments)    Sinus trouble   . Other     Dust mite, dust, cockroach eggs  . Tree Extract     Social History:  Social History   Social History  . Marital status: Married    Spouse name: N/A  . Number of children: N/A  . Years of education: N/A   Occupational History  . Not on file.   Social History Main Topics  . Smoking status: Former Smoker    Quit date: 03/20/1997  . Smokeless tobacco: Never Used  . Alcohol use No  . Drug use: No  . Sexual activity: No   Other Topics Concern  . Not on file   Social History Narrative  . No narrative on file   History  Smoking Status  . Former Smoker  . Quit date: 03/20/1997  Smokeless Tobacco  . Never Used   History  Alcohol Use No    Family History:  Family History  Problem Relation Age of Onset  . Heart disease Mother        CHF  . Mental illness Mother   . COPD Mother   . Emphysema Mother   . Scoliosis Daughter     Past medical history, surgical history, medications, allergies, family history and social history reviewed with patient today and changes made to appropriate areas of the chart.   Review of Systems  Constitutional: Negative.   HENT:       Chronic sinus/allergy issues, followed closely by ENT  Eyes: Negative.   Respiratory: Negative.   Cardiovascular: Negative.   Gastrointestinal: Negative.   Genitourinary: Negative.   Musculoskeletal: Negative.   Neurological: Negative.   Psychiatric/Behavioral: The patient is nervous/anxious and has insomnia.    All other ROS negative except what is listed above and in the HPI.      Objective:      BP 123/74   Pulse 75   Temp 97.8 F (36.6 C)   Ht 5' 6.5" (1.689 m)   Wt 142 lb (64.4 kg)   LMP  (LMP Unknown)   SpO2 100%   BMI 22.58 kg/m   Wt Readings from Last 3 Encounters:  07/06/17 142 lb (64.4 kg)  05/19/17 140 lb (63.5 kg)  02/12/17  143 lb (64.9 kg)    Physical Exam  Constitutional: She is oriented to person, place, and time. She appears well-developed and well-nourished. No distress.  HENT:  Head: Atraumatic.  Right Ear: External ear normal.  Left Ear: External ear normal.  Nose: Nose normal.  Mouth/Throat: Oropharynx is clear and moist. No oropharyngeal exudate.  Eyes: Conjunctivae are normal. Pupils are equal, round, and reactive to light. No scleral icterus.  Neck: Normal range of motion. Neck supple. No thyromegaly present.  Cardiovascular: Normal rate, regular rhythm, normal heart sounds and intact distal pulses.   Pulmonary/Chest: Effort normal and breath sounds normal. No respiratory distress.  Abdominal: Soft. Bowel sounds are normal. She exhibits no mass. There is no tenderness.  Musculoskeletal: Normal range of motion. She exhibits no edema or tenderness.  Lymphadenopathy:    She has no cervical adenopathy.  Neurological: She is alert and oriented to person, place, and time. No cranial nerve deficit.  Skin: Skin is warm and dry. No rash noted.  Psychiatric: She has a normal mood and affect. Her behavior is normal.  Nursing note and vitals reviewed.   Cognitive Testing - 6-CIT  Correct? Score   What year is it? yes 4 Yes = 0    No = 4  What month is it? yes 4 Yes = 0    No = 3  Remember:     [place], [address], [place]     What time is it? yes 4 Yes = 0    No = 3  Count backwards from 20 to 1 yes 4 Correct = 0    1 error = 2   More than 1 error = 4  Say the months of the year in reverse. yes 4 Correct = 0    1 error = 2   More than 1 error = 4  What address did I ask you to remember? yes 10 Correct = 0  1 error = 2    2 error = 4    3  error = 6    4 error = 8    All wrong = 10       TOTAL SCORE  30/28   Interpretation:  Normal  Normal (0-7) Abnormal (8-28)      Assessment & Plan:   Problem List Items Addressed This Visit      Cardiovascular and Mediastinum   Hypertension    Stable and WNL with good diet control and exercise. Continue these practices, monitor as able and call the office with persistent abnormal readings      Relevant Orders   CBC with Differential/Platelet   UA/M w/rflx Culture, Routine     Other   Hyperlipidemia    Has started eating more flax seeds and taking a fish oil supplement in attempt to reduce her TGs. Will recheck today and adjust as needed. Continue lifestyle modifications.       Relevant Orders   Comprehensive metabolic panel   Lipid Panel w/o Chol/HDL Ratio   Chronic anxiety    Has increased from half tab of xanax nightly to 3/4 tab due to significant family stresses the past 4 months. Stable on this dose, states she becomes groggy with anything more than this. Continue current regimen. Continue counseling with pastor, offered counselor list if needing any additional services.       Relevant Medications   ALPRAZolam (XANAX) 0.5 MG tablet   Advanced care planning/counseling discussion    Packet given,  long discussion regarding importance of considering these types of things long before anything may be to happen. Discussed types of situations where this information would come into play. Pt somewhat resistant at this time but agrees to look over the information packet provided to start making some decisions. Will check back in at next appt for any updates.        Other Visit Diagnoses    Encounter for Medicare annual wellness exam    -  Primary   Await cologuard, declines DEXA.        Preventative Services:  Health Risk Assessment and Personalized Prevention Plan: discussed Bone Mass Measurements: refused repeat DEXA Breast Cancer Screening: mammogram ordered CVD  Screening: await results Cervical Cancer Screening: N/A Colon Cancer Screening: cologuard pending Depression Screening: completed today Diabetes Screening: await results Glaucoma Screening:  Hepatitis B vaccine: UTD Hepatitis C screening: UTD HIV Screening: refused Flu Vaccine: postponed Lung cancer Screening: N/A Obesity Screening: N/A Pneumonia Vaccines (2): UTD STI Screening: N/A  Follow up plan: Return in about 6 months (around 01/06/2018) for BP, Chol f/u.   LABORATORY TESTING:  - Pap smear: not applicable  IMMUNIZATIONS:   - Tdap: Tetanus vaccination status reviewed: last tetanus booster within 10 years. - Influenza: Postponed to flu season - Pneumovax: Up to date - Prevnar: Up to date - Zostavax vaccine: Up to date  SCREENING: -Mammogram: Up to date  - Colonoscopy: Cologuard ordered through GYN  - Bone Density: Refused   PATIENT COUNSELING:   Advised to take 1 mg of folate supplement per day if capable of pregnancy.   Sexuality: Discussed sexually transmitted diseases, partner selection, use of condoms, avoidance of unintended pregnancy  and contraceptive alternatives.   Advised to avoid cigarette smoking.  I discussed with the patient that most people either abstain from alcohol or drink within safe limits (<=14/week and <=4 drinks/occasion for males, <=7/weeks and <= 3 drinks/occasion for females) and that the risk for alcohol disorders and other health effects rises proportionally with the number of drinks per week and how often a drinker exceeds daily limits.  Discussed cessation/primary prevention of drug use and availability of treatment for abuse.   Diet: Encouraged to adjust caloric intake to maintain  or achieve ideal body weight, to reduce intake of dietary saturated fat and total fat, to limit sodium intake by avoiding high sodium foods and not adding table salt, and to maintain adequate dietary potassium and calcium preferably from fresh fruits,  vegetables, and low-fat dairy products.    stressed the importance of regular exercise  Injury prevention: Discussed safety belts, safety helmets, smoke detector, smoking near bedding or upholstery.   Dental health: Discussed importance of regular tooth brushing, flossing, and dental visits.    NEXT PREVENTATIVE PHYSICAL DUE IN 1 YEAR. Return in about 6 months (around 01/06/2018) for BP, Chol f/u.

## 2017-07-06 NOTE — Assessment & Plan Note (Signed)
Has increased from half tab of xanax nightly to 3/4 tab due to significant family stresses the past 4 months. Stable on this dose, states she becomes groggy with anything more than this. Continue current regimen. Continue counseling with pastor, offered counselor list if needing any additional services.

## 2017-07-07 ENCOUNTER — Telehealth: Payer: Self-pay | Admitting: Family Medicine

## 2017-07-07 LAB — LIPID PANEL W/O CHOL/HDL RATIO
Cholesterol, Total: 233 mg/dL — ABNORMAL HIGH (ref 100–199)
HDL: 105 mg/dL (ref >39)
LDL CALC: 99 mg/dL (ref 0–99)
TRIGLYCERIDES: 147 mg/dL (ref 0–149)
VLDL CHOLESTEROL CAL: 29 mg/dL (ref 5–40)

## 2017-07-07 LAB — COMPREHENSIVE METABOLIC PANEL
ALBUMIN: 4.5 g/dL (ref 3.6–4.8)
ALT: 17 IU/L (ref 0–32)
AST: 19 IU/L (ref 0–40)
Albumin/Globulin Ratio: 1.8 (ref 1.2–2.2)
Alkaline Phosphatase: 104 IU/L (ref 39–117)
BUN/Creatinine Ratio: 23 (ref 12–28)
BUN: 17 mg/dL (ref 8–27)
Bilirubin Total: 0.4 mg/dL (ref 0.0–1.2)
CALCIUM: 9.5 mg/dL (ref 8.7–10.3)
CO2: 25 mmol/L (ref 20–29)
CREATININE: 0.73 mg/dL (ref 0.57–1.00)
Chloride: 104 mmol/L (ref 96–106)
GFR calc Af Amer: 97 mL/min/{1.73_m2} (ref >59)
GFR, EST NON AFRICAN AMERICAN: 84 mL/min/{1.73_m2} (ref >59)
GLOBULIN, TOTAL: 2.5 g/dL (ref 1.5–4.5)
GLUCOSE: 98 mg/dL (ref 65–99)
Potassium: 5.6 mmol/L — ABNORMAL HIGH (ref 3.5–5.2)
SODIUM: 144 mmol/L (ref 134–144)
Total Protein: 7 g/dL (ref 6.0–8.5)

## 2017-07-07 LAB — CBC WITH DIFFERENTIAL/PLATELET
Basophils Absolute: 0 10*3/uL (ref 0.0–0.2)
Basos: 0 %
EOS (ABSOLUTE): 0.1 10*3/uL (ref 0.0–0.4)
Eos: 1 %
HEMOGLOBIN: 13.3 g/dL (ref 11.1–15.9)
Hematocrit: 41.7 % (ref 34.0–46.6)
IMMATURE GRANULOCYTES: 0 %
Immature Grans (Abs): 0 10*3/uL (ref 0.0–0.1)
Lymphocytes Absolute: 2.4 10*3/uL (ref 0.7–3.1)
Lymphs: 37 %
MCH: 30.1 pg (ref 26.6–33.0)
MCHC: 31.9 g/dL (ref 31.5–35.7)
MCV: 94 fL (ref 79–97)
MONOCYTES: 8 %
Monocytes Absolute: 0.5 10*3/uL (ref 0.1–0.9)
NEUTROS PCT: 54 %
Neutrophils Absolute: 3.5 10*3/uL (ref 1.4–7.0)
Platelets: 205 10*3/uL (ref 150–379)
RBC: 4.42 x10E6/uL (ref 3.77–5.28)
RDW: 13.5 % (ref 12.3–15.4)
WBC: 6.5 10*3/uL (ref 3.4–10.8)

## 2017-07-07 NOTE — Telephone Encounter (Signed)
Pt would like to receive messages through mychart. She stated that she would also like to know specific fruits to stay away from.

## 2017-07-07 NOTE — Telephone Encounter (Signed)
Please call and let her know her labs came back normal except for an elevated potassium. Have her cut back or stop any supplements she's taking that include potassium, and cut back on fruits and other high potassium foods. Let's recheck in 2 weeks to make sure level has normalized

## 2017-07-07 NOTE — Telephone Encounter (Signed)
Left message to call.

## 2017-07-07 NOTE — Telephone Encounter (Signed)
We assumed that GYN had ordered mammogram. I placed the order. Left message to call.

## 2017-07-08 ENCOUNTER — Encounter: Payer: Self-pay | Admitting: Family Medicine

## 2017-07-08 NOTE — Telephone Encounter (Signed)
Mychart message sent to patient.

## 2017-07-08 NOTE — Telephone Encounter (Signed)
Routing to provider. I already sent her a message in regards to the mammo appt.

## 2017-07-08 NOTE — Telephone Encounter (Signed)
Mychart message sent.

## 2017-07-08 NOTE — Telephone Encounter (Signed)
Routing to provider  

## 2017-07-14 ENCOUNTER — Encounter: Payer: PPO | Admitting: Unknown Physician Specialty

## 2017-07-16 DIAGNOSIS — J301 Allergic rhinitis due to pollen: Secondary | ICD-10-CM | POA: Diagnosis not present

## 2017-07-23 DIAGNOSIS — J301 Allergic rhinitis due to pollen: Secondary | ICD-10-CM | POA: Diagnosis not present

## 2017-07-27 DIAGNOSIS — H5213 Myopia, bilateral: Secondary | ICD-10-CM | POA: Diagnosis not present

## 2017-07-27 DIAGNOSIS — H524 Presbyopia: Secondary | ICD-10-CM | POA: Diagnosis not present

## 2017-07-27 DIAGNOSIS — H52223 Regular astigmatism, bilateral: Secondary | ICD-10-CM | POA: Diagnosis not present

## 2017-07-30 DIAGNOSIS — J301 Allergic rhinitis due to pollen: Secondary | ICD-10-CM | POA: Diagnosis not present

## 2017-07-31 DIAGNOSIS — J301 Allergic rhinitis due to pollen: Secondary | ICD-10-CM | POA: Diagnosis not present

## 2017-08-06 DIAGNOSIS — J301 Allergic rhinitis due to pollen: Secondary | ICD-10-CM | POA: Diagnosis not present

## 2017-08-13 ENCOUNTER — Encounter: Payer: Self-pay | Admitting: Family Medicine

## 2017-08-13 ENCOUNTER — Ambulatory Visit (INDEPENDENT_AMBULATORY_CARE_PROVIDER_SITE_OTHER): Payer: PPO | Admitting: Family Medicine

## 2017-08-13 VITALS — BP 118/78 | HR 76 | Temp 98.7°F | Wt 140.0 lb

## 2017-08-13 DIAGNOSIS — J0111 Acute recurrent frontal sinusitis: Secondary | ICD-10-CM | POA: Diagnosis not present

## 2017-08-13 DIAGNOSIS — J301 Allergic rhinitis due to pollen: Secondary | ICD-10-CM | POA: Diagnosis not present

## 2017-08-13 MED ORDER — DOXYCYCLINE MONOHYDRATE 100 MG PO CAPS: 100.0000 mg | ORAL_CAPSULE | Freq: Two times a day (BID) | ORAL | 0 refills | Status: DC

## 2017-08-13 NOTE — Progress Notes (Signed)
   BP 118/78   Pulse 76   Temp 98.7 F (37.1 C)   Wt 140 lb (63.5 kg)   LMP  (LMP Unknown)   SpO2 97%   BMI 22.26 kg/m    Subjective:    Patient ID: Hailey Wall, female    DOB: 1946-05-05, 71 y.o.   MRN: [id number]  HPI: Hailey Wall is a 71 y.o. female  Chief Complaint  Patient presents with  . Sinusitis    x 10 days,head congestion, sinus drainage, sore throat, h/a's, some cough. taking advil and mucinex, allegra. Also left ear pain so using ear drops given by ENT.   Patient presents with 10 day hx of congestion, sinus pain and pressure, sore throat, HAs, productive cough, and left ear pain. Long hx of sinus issues. Taking allegra, mucinex DM, and nasal sprays with minimal relief. Denies CP, SOB, fevers.   Relevant past medical, surgical, family and social history reviewed and updated as indicated. Interim medical history since our last visit reviewed. Allergies and medications reviewed and updated.  Review of Systems  Constitutional: Negative.   HENT: Positive for congestion, ear pain, sinus pain, sinus pressure and sore throat.   Eyes: Negative.   Respiratory: Positive for cough.   Cardiovascular: Negative.   Gastrointestinal: Negative.   Genitourinary: Negative.   Musculoskeletal: Negative.   Neurological: Positive for headaches.  Psychiatric/Behavioral: Negative.    Per HPI unless specifically indicated above     Objective:    BP 118/78   Pulse 76   Temp 98.7 F (37.1 C)   Wt 140 lb (63.5 kg)   LMP  (LMP Unknown)   SpO2 97%   BMI 22.26 kg/m   Wt Readings from Last 3 Encounters:  08/13/17 140 lb (63.5 kg)  07/06/17 142 lb (64.4 kg)  05/19/17 140 lb (63.5 kg)    Physical Exam  Constitutional: She is oriented to person, place, and time. She appears well-developed and well-nourished. No distress.  HENT:  Head: Atraumatic.  Oropharynx erythematous with mild tonsillar edema Frontal sinuses ttp  Eyes: Pupils are equal, round, and reactive to  light. Conjunctivae are normal.  Neck: Normal range of motion. Neck supple.  Cardiovascular: Normal rate and normal heart sounds.   Pulmonary/Chest: Effort normal and breath sounds normal.  Abdominal: Soft. Bowel sounds are normal.  Musculoskeletal: Normal range of motion.  Neurological: She is alert and oriented to person, place, and time.  Skin: Skin is warm and dry.  Psychiatric: She has a normal mood and affect. Her behavior is normal.  Nursing note and vitals reviewed.     Assessment & Plan:   Problem List Items Addressed This Visit    None    Visit Diagnoses    Acute recurrent frontal sinusitis    -  Primary   Historically only responds to monodox, continue OTC medications and supportive care. F/u if no improvement   Relevant Medications   doxycycline (MONODOX) 100 MG capsule       Follow up plan: Return if symptoms worsen or fail to improve.

## 2017-08-13 NOTE — Patient Instructions (Signed)
Follow up as needed

## 2017-08-19 DIAGNOSIS — J301 Allergic rhinitis due to pollen: Secondary | ICD-10-CM | POA: Diagnosis not present

## 2017-08-25 DIAGNOSIS — J301 Allergic rhinitis due to pollen: Secondary | ICD-10-CM | POA: Diagnosis not present

## 2017-08-25 DIAGNOSIS — H7202 Central perforation of tympanic membrane, left ear: Secondary | ICD-10-CM | POA: Diagnosis not present

## 2017-08-27 DIAGNOSIS — J301 Allergic rhinitis due to pollen: Secondary | ICD-10-CM | POA: Diagnosis not present

## 2017-09-02 DIAGNOSIS — J301 Allergic rhinitis due to pollen: Secondary | ICD-10-CM | POA: Diagnosis not present

## 2017-09-09 DIAGNOSIS — J301 Allergic rhinitis due to pollen: Secondary | ICD-10-CM | POA: Diagnosis not present

## 2017-09-16 DIAGNOSIS — J301 Allergic rhinitis due to pollen: Secondary | ICD-10-CM | POA: Diagnosis not present

## 2017-09-24 ENCOUNTER — Ambulatory Visit
Admission: RE | Admit: 2017-09-24 | Discharge: 2017-09-24 | Disposition: A | Discharge status: Home / Self Care | Payer: PPO | Source: Ambulatory Visit | Attending: Family Medicine | Admitting: Family Medicine

## 2017-09-24 DIAGNOSIS — Z1231 Encounter for screening mammogram for malignant neoplasm of breast: Secondary | ICD-10-CM | POA: Insufficient documentation

## 2017-09-24 DIAGNOSIS — J301 Allergic rhinitis due to pollen: Secondary | ICD-10-CM | POA: Diagnosis not present

## 2017-10-01 DIAGNOSIS — J301 Allergic rhinitis due to pollen: Secondary | ICD-10-CM | POA: Diagnosis not present

## 2017-10-06 ENCOUNTER — Ambulatory Visit (INDEPENDENT_AMBULATORY_CARE_PROVIDER_SITE_OTHER): Payer: PPO

## 2017-10-06 DIAGNOSIS — Z23 Encounter for immunization: Secondary | ICD-10-CM

## 2017-10-08 DIAGNOSIS — J301 Allergic rhinitis due to pollen: Secondary | ICD-10-CM | POA: Diagnosis not present

## 2017-10-15 DIAGNOSIS — J301 Allergic rhinitis due to pollen: Secondary | ICD-10-CM | POA: Diagnosis not present

## 2017-10-21 DIAGNOSIS — J301 Allergic rhinitis due to pollen: Secondary | ICD-10-CM | POA: Diagnosis not present

## 2017-10-23 DIAGNOSIS — J301 Allergic rhinitis due to pollen: Secondary | ICD-10-CM | POA: Diagnosis not present

## 2017-10-28 DIAGNOSIS — J301 Allergic rhinitis due to pollen: Secondary | ICD-10-CM | POA: Diagnosis not present

## 2017-11-05 DIAGNOSIS — J301 Allergic rhinitis due to pollen: Secondary | ICD-10-CM | POA: Diagnosis not present

## 2017-11-12 DIAGNOSIS — J301 Allergic rhinitis due to pollen: Secondary | ICD-10-CM | POA: Diagnosis not present

## 2017-11-23 DIAGNOSIS — J301 Allergic rhinitis due to pollen: Secondary | ICD-10-CM | POA: Diagnosis not present

## 2017-11-26 DIAGNOSIS — H6123 Impacted cerumen, bilateral: Secondary | ICD-10-CM | POA: Diagnosis not present

## 2017-11-26 DIAGNOSIS — J019 Acute sinusitis, unspecified: Secondary | ICD-10-CM | POA: Diagnosis not present

## 2017-11-26 DIAGNOSIS — H6980 Other specified disorders of Eustachian tube, unspecified ear: Secondary | ICD-10-CM | POA: Diagnosis not present

## 2017-11-26 DIAGNOSIS — K219 Gastro-esophageal reflux disease without esophagitis: Secondary | ICD-10-CM | POA: Diagnosis not present

## 2017-12-03 ENCOUNTER — Ambulatory Visit: Payer: PPO | Admitting: Family Medicine

## 2017-12-03 ENCOUNTER — Encounter: Payer: Self-pay | Admitting: Family Medicine

## 2017-12-03 VITALS — BP 124/80 | HR 100 | Resp 16 | Ht 66.5 in | Wt 143.0 lb

## 2017-12-03 DIAGNOSIS — F419 Anxiety disorder, unspecified: Secondary | ICD-10-CM

## 2017-12-03 MED ORDER — ALPRAZOLAM 0.5 MG PO TABS: 0.5000 mg | ORAL_TABLET | Freq: Every evening | ORAL | 0 refills | Status: DC | PRN

## 2017-12-03 NOTE — Patient Instructions (Signed)
Follow up in 3 months

## 2017-12-03 NOTE — Assessment & Plan Note (Signed)
Stable on current regimen, continue xanax 3/4 tab QHS prn. Continue church counseling. F/u in 3 months

## 2017-12-03 NOTE — Progress Notes (Addendum)
BP 124/80   Pulse 100   Resp 16   Ht 5' 6.5" (1.689 m)   Wt 143 lb (64.9 kg)   LMP  (LMP Unknown)   SpO2 100%   BMI 22.74 kg/m    Subjective:    Patient ID: Hailey Wall, female    DOB: 07/25/1946, 71 y.o.   MRN: [id number]  HPI: Hailey Wall is a 71 y.o. female  Chief Complaint  Patient presents with  . Anxiety    xanax refill             Patient presents today for anxiety f/u. Used to take a 1/2 tab nightly, now taking 3/4 tab nightly as needed and doing very well. Still having lots of family stress but otherwise things are good. Has only had a handful of days the past few months where she's felt like she almost had a panic episode, but the medicine always calms things down. No side effects or concerns with it.   Depression screen [facility] 2/9 12/03/2017 07/06/2017 07/08/2016  Decreased Interest 0 0 0  Down, Depressed, Hopeless 0 0 0  PHQ - 2 Score 0 0 0   GAD 7 : Generalized Anxiety Score 12/03/2017  Nervous, Anxious, on Edge 0  Control/stop worrying 0  Worry too much - different things 0  Trouble relaxing 0  Restless 1  Easily annoyed or irritable 0  Afraid - awful might happen 0  Total GAD 7 Score 1     Relevant past medical, surgical, family and social history reviewed and updated as indicated. Interim medical history since our last visit reviewed. Allergies and medications reviewed and updated.  Review of Systems  Constitutional: Negative.   HENT: Negative.   Respiratory: Negative.   Cardiovascular: Negative.   Gastrointestinal: Negative.   Genitourinary: Negative.   Musculoskeletal: Negative.   Neurological: Negative.   Psychiatric/Behavioral: The patient is nervous/anxious.     Per HPI unless specifically indicated above     Objective:    BP 124/80   Pulse 100   Resp 16   Ht 5' 6.5" (1.689 m)   Wt 143 lb (64.9 kg)   LMP  (LMP Unknown)   SpO2 100%   BMI 22.74 kg/m   Wt Readings from Last 3 Encounters:  12/03/17 143 lb (64.9 kg)    08/13/17 140 lb (63.5 kg)  07/06/17 142 lb (64.4 kg)    Physical Exam  Constitutional: She is oriented to person, place, and time. She appears well-developed and well-nourished. No distress.  HENT:  Head: Atraumatic.  Eyes: Conjunctivae are normal. Pupils are equal, round, and reactive to light. No scleral icterus.  Neck: Normal range of motion. Neck supple.  Cardiovascular: Normal rate and normal heart sounds.  Pulmonary/Chest: Effort normal. No respiratory distress.  Musculoskeletal: Normal range of motion.  Neurological: She is alert and oriented to person, place, and time.  Skin: Skin is warm and dry.  Psychiatric: She has a normal mood and affect. Her behavior is normal.  Nursing note and vitals reviewed.     Assessment & Plan:   Problem List Items Addressed This Visit      Other   Chronic anxiety - Primary    Stable on current regimen, continue xanax 3/4 tab QHS prn. Continue church counseling. F/u in 3 months      Relevant Medications   ALPRAZolam (XANAX) 0.5 MG tablet       Follow up plan: Return in about 3 months (around 03/03/2018) for  Anxiety f/u in 3 months, CPE in July - with Malachy Mood.

## 2017-12-10 DIAGNOSIS — J301 Allergic rhinitis due to pollen: Secondary | ICD-10-CM | POA: Diagnosis not present

## 2017-12-14 DIAGNOSIS — J301 Allergic rhinitis due to pollen: Secondary | ICD-10-CM | POA: Diagnosis not present

## 2018-01-07 DIAGNOSIS — J301 Allergic rhinitis due to pollen: Secondary | ICD-10-CM | POA: Diagnosis not present

## 2018-01-08 ENCOUNTER — Encounter: Payer: Self-pay | Admitting: Obstetrics & Gynecology

## 2018-01-08 ENCOUNTER — Ambulatory Visit (INDEPENDENT_AMBULATORY_CARE_PROVIDER_SITE_OTHER): Payer: PPO | Admitting: Obstetrics & Gynecology

## 2018-01-08 VITALS — BP 120/80 | HR 86 | Ht 67.0 in | Wt 142.0 lb

## 2018-01-08 DIAGNOSIS — R350 Frequency of micturition: Secondary | ICD-10-CM
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
## 2018-01-08 NOTE — Patient Instructions (Signed)

## 2018-01-11 DIAGNOSIS — M47816 Spondylosis without myelopathy or radiculopathy, lumbar region: Secondary | ICD-10-CM | POA: Diagnosis not present

## 2018-01-11 DIAGNOSIS — M47817 Spondylosis without myelopathy or radiculopathy, lumbosacral region: Secondary | ICD-10-CM | POA: Diagnosis not present

## 2018-01-11 DIAGNOSIS — M4316 Spondylolisthesis, lumbar region: Secondary | ICD-10-CM | POA: Diagnosis not present

## 2018-01-11 DIAGNOSIS — M545 Low back pain: Secondary | ICD-10-CM | POA: Diagnosis not present

## 2018-01-14 ENCOUNTER — Other Ambulatory Visit: Payer: Self-pay | Admitting: Family Medicine

## 2018-01-15 ENCOUNTER — Telehealth: Payer: Self-pay

## 2018-01-15 ENCOUNTER — Other Ambulatory Visit: Payer: Self-pay

## 2018-01-15 DIAGNOSIS — N952 Postmenopausal atrophic vaginitis: Secondary | ICD-10-CM

## 2018-01-15 MED ORDER — ESTRIOL 10 % CREA: 1.5000 g | TOPICAL_CREAM | 12 refills | Status: DC

## 2018-01-15 NOTE — Telephone Encounter (Signed)
done

## 2018-01-15 NOTE — Telephone Encounter (Signed)
Pt states she just found out that [facility] is closing on Wednesday. She just got her Estriol 10 % CREAM refilled, but she will need that rx sent to [facility] in [place] in the future. Pt requests this to go ahead & be done & Pharmacy requests that you note on rx to hold til ready to fill. VU#[phone].

## 2018-01-20 DIAGNOSIS — J301 Allergic rhinitis due to pollen: Secondary | ICD-10-CM | POA: Diagnosis not present

## 2018-01-20 NOTE — Telephone Encounter (Signed)
Already changed

## 2018-01-20 NOTE — Telephone Encounter (Signed)
Pt is asking to have her Pharmacy updated to  [facility] from here on

## 2018-01-21 DIAGNOSIS — J301 Allergic rhinitis due to pollen: Secondary | ICD-10-CM | POA: Diagnosis not present

## 2018-01-25 ENCOUNTER — Encounter: Payer: Self-pay | Admitting: Unknown Physician Specialty

## 2018-01-25 ENCOUNTER — Ambulatory Visit (INDEPENDENT_AMBULATORY_CARE_PROVIDER_SITE_OTHER): Payer: PPO | Admitting: Unknown Physician Specialty

## 2018-01-25 VITALS — BP 132/81 | HR 74 | Temp 98.2°F | Wt 142.2 lb

## 2018-01-25 DIAGNOSIS — M542 Cervicalgia: Secondary | ICD-10-CM | POA: Diagnosis not present

## 2018-01-25 DIAGNOSIS — M546 Pain in thoracic spine: Secondary | ICD-10-CM | POA: Diagnosis not present

## 2018-01-25 MED ORDER — IBUPROFEN 600 MG PO TABS: 600.0000 mg | ORAL_TABLET | Freq: Three times a day (TID) | ORAL | 0 refills | Status: DC | PRN

## 2018-01-25 MED ORDER — BACLOFEN 10 MG PO TABS: 10.0000 mg | ORAL_TABLET | Freq: Three times a day (TID) | ORAL | 0 refills | Status: DC
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
## 2018-01-28 DIAGNOSIS — J301 Allergic rhinitis due to pollen: Secondary | ICD-10-CM | POA: Diagnosis not present

## 2018-02-03 DIAGNOSIS — J301 Allergic rhinitis due to pollen: Secondary | ICD-10-CM | POA: Diagnosis not present

## 2018-02-09 DIAGNOSIS — J301 Allergic rhinitis due to pollen: Secondary | ICD-10-CM | POA: Diagnosis not present

## 2018-02-15 ENCOUNTER — Other Ambulatory Visit: Payer: Self-pay | Admitting: Family Medicine

## 2018-02-16 ENCOUNTER — Encounter: Payer: Self-pay | Admitting: Unknown Physician Specialty

## 2018-02-16 ENCOUNTER — Ambulatory Visit (INDEPENDENT_AMBULATORY_CARE_PROVIDER_SITE_OTHER): Payer: PPO | Admitting: Unknown Physician Specialty

## 2018-02-16 DIAGNOSIS — F419 Anxiety disorder, unspecified: Secondary | ICD-10-CM | POA: Diagnosis not present

## 2018-02-16 MED ORDER — ALPRAZOLAM 0.5 MG PO TABS: 0.5000 mg | ORAL_TABLET | Freq: Every evening | ORAL | 2 refills | Status: DC | PRN
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
## 2018-02-16 NOTE — Assessment & Plan Note (Signed)
Discussed with pt that she is currently takes Xanax .5 mg daily which she has taken for a long period of time.  Controlled substances agreement written.  She will come see me every 3 months

## 2018-02-17 DIAGNOSIS — J301 Allergic rhinitis due to pollen: Secondary | ICD-10-CM | POA: Diagnosis not present

## 2018-02-25 DIAGNOSIS — H6980 Other specified disorders of Eustachian tube, unspecified ear: Secondary | ICD-10-CM | POA: Diagnosis not present

## 2018-02-25 DIAGNOSIS — H6123 Impacted cerumen, bilateral: Secondary | ICD-10-CM | POA: Diagnosis not present

## 2018-02-25 DIAGNOSIS — J069 Acute upper respiratory infection, unspecified: Secondary | ICD-10-CM | POA: Diagnosis not present

## 2018-02-25 DIAGNOSIS — J301 Allergic rhinitis due to pollen: Secondary | ICD-10-CM | POA: Diagnosis not present

## 2018-02-25 DIAGNOSIS — H7202 Central perforation of tympanic membrane, left ear: Secondary | ICD-10-CM | POA: Diagnosis not present

## 2018-03-04 ENCOUNTER — Ambulatory Visit: Payer: PPO | Admitting: Family Medicine

## 2018-03-04 DIAGNOSIS — J301 Allergic rhinitis due to pollen: Secondary | ICD-10-CM | POA: Diagnosis not present

## 2018-03-08 ENCOUNTER — Other Ambulatory Visit: Payer: Self-pay | Admitting: Unknown Physician Specialty

## 2018-03-08 MED ORDER — IBUPROFEN 600 MG PO TABS: 600.0000 mg | ORAL_TABLET | Freq: Three times a day (TID) | ORAL | 6 refills | Status: DC | PRN

## 2018-03-08 NOTE — Telephone Encounter (Signed)
Routing to provider  

## 2018-03-15 ENCOUNTER — Telehealth: Payer: Self-pay | Admitting: Obstetrics & Gynecology

## 2018-03-15 DIAGNOSIS — N952 Postmenopausal atrophic vaginitis: Secondary | ICD-10-CM

## 2018-03-15 NOTE — Telephone Encounter (Signed)
Hailey Wall, it calling to speak with Hailey Wall about an prescription. Please call [phone] ex [phone]

## 2018-03-15 NOTE — Telephone Encounter (Signed)
Pt is calling to follow up on her Medication issues with the [facility]. Please call patient

## 2018-03-15 NOTE — Telephone Encounter (Signed)
Spoke with pharmacy about refill on rx. Refill until may that's when pt comes in for annual

## 2018-03-16 ENCOUNTER — Other Ambulatory Visit: Payer: Self-pay | Admitting: Obstetrics & Gynecology
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
## 2018-03-16 NOTE — Telephone Encounter (Signed)
What meds does she need from [place] to renew.  Will refill, and she can come if she has any xoncerns.

## 2018-03-16 NOTE — Telephone Encounter (Signed)
Pt just left pharm, they said they do not have any refills on file. fwding to JP. CB [phone]

## 2018-03-17 DIAGNOSIS — J301 Allergic rhinitis due to pollen: Secondary | ICD-10-CM | POA: Diagnosis not present

## 2018-03-17 MED ORDER — ESTRIOL 10 % CREA: 1.5000 g | TOPICAL_CREAM | 12 refills | Status: DC

## 2018-03-17 NOTE — Telephone Encounter (Signed)
Estriol cream

## 2018-03-17 NOTE — Telephone Encounter (Signed)
Updated at pharmacy in [place]

## 2018-03-19 ENCOUNTER — Telehealth: Payer: Self-pay

## 2018-03-19 NOTE — Telephone Encounter (Signed)
Pt would like to know if she needs to make an Appt c Dr.Harris in May so she can have her Estriol cream refilled.

## 2018-03-19 NOTE — Telephone Encounter (Signed)
Called pt to tell her I will send the request to [facility] nurse to be refilled if possible

## 2018-03-23 ENCOUNTER — Ambulatory Visit (INDEPENDENT_AMBULATORY_CARE_PROVIDER_SITE_OTHER): Payer: PPO | Admitting: Vascular Surgery

## 2018-03-23 ENCOUNTER — Encounter (INDEPENDENT_AMBULATORY_CARE_PROVIDER_SITE_OTHER): Payer: Self-pay | Admitting: Vascular Surgery

## 2018-03-23 VITALS — BP 128/72 | HR 75 | Resp 13 | Ht 67.0 in | Wt 144.0 lb

## 2018-03-23 DIAGNOSIS — M79604 Pain in right leg: Secondary | ICD-10-CM

## 2018-03-23 DIAGNOSIS — I10 Essential (primary) hypertension: Secondary | ICD-10-CM | POA: Diagnosis not present
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
## 2018-03-23 NOTE — Assessment & Plan Note (Signed)
blood pressure control important in reducing the progression of atherosclerotic disease. On appropriate oral medications.  
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
## 2018-03-25 DIAGNOSIS — J301 Allergic rhinitis due to pollen: Secondary | ICD-10-CM | POA: Diagnosis not present

## 2018-03-30 DIAGNOSIS — M7501 Adhesive capsulitis of right shoulder: Secondary | ICD-10-CM | POA: Diagnosis not present

## 2018-03-30 DIAGNOSIS — S139XXA Sprain of joints and ligaments of unspecified parts of neck, initial encounter: Secondary | ICD-10-CM | POA: Diagnosis not present

## 2018-03-31 DIAGNOSIS — J301 Allergic rhinitis due to pollen: Secondary | ICD-10-CM | POA: Diagnosis not present

## 2018-04-02 DIAGNOSIS — M542 Cervicalgia: Secondary | ICD-10-CM | POA: Diagnosis not present

## 2018-04-02 DIAGNOSIS — M50322 Other cervical disc degeneration at C5-C6 level: Secondary | ICD-10-CM | POA: Diagnosis not present

## 2018-04-05 DIAGNOSIS — J301 Allergic rhinitis due to pollen: Secondary | ICD-10-CM | POA: Diagnosis not present

## 2018-04-05 DIAGNOSIS — M50322 Other cervical disc degeneration at C5-C6 level: Secondary | ICD-10-CM | POA: Diagnosis not present

## 2018-04-05 DIAGNOSIS — M542 Cervicalgia: Secondary | ICD-10-CM | POA: Diagnosis not present

## 2018-04-08 DIAGNOSIS — M542 Cervicalgia: Secondary | ICD-10-CM | POA: Diagnosis not present

## 2018-04-08 DIAGNOSIS — M50322 Other cervical disc degeneration at C5-C6 level: Secondary | ICD-10-CM | POA: Diagnosis not present

## 2018-04-12 DIAGNOSIS — M50322 Other cervical disc degeneration at C5-C6 level: Secondary | ICD-10-CM | POA: Diagnosis not present

## 2018-04-12 DIAGNOSIS — M542 Cervicalgia: Secondary | ICD-10-CM | POA: Diagnosis not present

## 2018-04-13 DIAGNOSIS — J301 Allergic rhinitis due to pollen: Secondary | ICD-10-CM | POA: Diagnosis not present

## 2018-04-13 DIAGNOSIS — M50322 Other cervical disc degeneration at C5-C6 level: Secondary | ICD-10-CM | POA: Diagnosis not present

## 2018-04-13 DIAGNOSIS — M542 Cervicalgia: Secondary | ICD-10-CM | POA: Diagnosis not present

## 2018-04-19 DIAGNOSIS — M50322 Other cervical disc degeneration at C5-C6 level: Secondary | ICD-10-CM | POA: Diagnosis not present

## 2018-04-19 DIAGNOSIS — M542 Cervicalgia: Secondary | ICD-10-CM | POA: Diagnosis not present

## 2018-04-20 DIAGNOSIS — M50322 Other cervical disc degeneration at C5-C6 level: Secondary | ICD-10-CM | POA: Diagnosis not present

## 2018-04-20 DIAGNOSIS — M542 Cervicalgia: Secondary | ICD-10-CM | POA: Diagnosis not present

## 2018-04-22 DIAGNOSIS — J301 Allergic rhinitis due to pollen: Secondary | ICD-10-CM | POA: Diagnosis not present

## 2018-04-26 DIAGNOSIS — M50322 Other cervical disc degeneration at C5-C6 level: Secondary | ICD-10-CM | POA: Diagnosis not present

## 2018-04-26 DIAGNOSIS — M542 Cervicalgia: Secondary | ICD-10-CM | POA: Diagnosis not present

## 2018-04-27 ENCOUNTER — Ambulatory Visit (INDEPENDENT_AMBULATORY_CARE_PROVIDER_SITE_OTHER): Payer: PPO | Admitting: Vascular Surgery

## 2018-04-27 ENCOUNTER — Encounter (INDEPENDENT_AMBULATORY_CARE_PROVIDER_SITE_OTHER): Payer: Self-pay | Admitting: Vascular Surgery

## 2018-04-27 ENCOUNTER — Ambulatory Visit (INDEPENDENT_AMBULATORY_CARE_PROVIDER_SITE_OTHER): Payer: PPO

## 2018-04-27 VITALS — BP 118/67 | HR 71 | Resp 17 | Ht 67.0 in | Wt 146.6 lb

## 2018-04-27 DIAGNOSIS — M79604 Pain in right leg: Secondary | ICD-10-CM

## 2018-04-27 DIAGNOSIS — M50322 Other cervical disc degeneration at C5-C6 level: Secondary | ICD-10-CM | POA: Diagnosis not present

## 2018-04-27 DIAGNOSIS — I83813 Varicose veins of bilateral lower extremities with pain: Secondary | ICD-10-CM

## 2018-04-27 DIAGNOSIS — M542 Cervicalgia: Secondary | ICD-10-CM | POA: Diagnosis not present

## 2018-04-27 DIAGNOSIS — I10 Essential (primary) hypertension: Secondary | ICD-10-CM
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
## 2018-04-28 DIAGNOSIS — M50322 Other cervical disc degeneration at C5-C6 level: Secondary | ICD-10-CM | POA: Diagnosis not present

## 2018-04-28 DIAGNOSIS — M542 Cervicalgia: Secondary | ICD-10-CM | POA: Diagnosis not present

## 2018-04-28 NOTE — Assessment & Plan Note (Signed)
The venous duplex today reveals continued ablation of the saphenous veins with no evidence of DVT, SVT, or significant venous reflux. Her leg pain at this point is not due to venous disease.  Would suspect sciatica/neuropathic pain at this point. RTC PRN

## 2018-04-29 ENCOUNTER — Telehealth (INDEPENDENT_AMBULATORY_CARE_PROVIDER_SITE_OTHER): Payer: Self-pay | Admitting: Vascular Surgery

## 2018-04-29 DIAGNOSIS — J301 Allergic rhinitis due to pollen: Secondary | ICD-10-CM | POA: Diagnosis not present

## 2018-04-29 DIAGNOSIS — M5431 Sciatica, right side: Secondary | ICD-10-CM | POA: Diagnosis not present

## 2018-04-29 NOTE — Telephone Encounter (Signed)
Patient needs to sign a medical records release and then print off Dew last office note please.

## 2018-05-03 DIAGNOSIS — M50322 Other cervical disc degeneration at C5-C6 level: Secondary | ICD-10-CM | POA: Diagnosis not present

## 2018-05-03 DIAGNOSIS — M542 Cervicalgia: Secondary | ICD-10-CM | POA: Diagnosis not present

## 2018-05-04 DIAGNOSIS — H6060 Unspecified chronic otitis externa, unspecified ear: Secondary | ICD-10-CM | POA: Diagnosis not present

## 2018-05-04 DIAGNOSIS — J329 Chronic sinusitis, unspecified: Secondary | ICD-10-CM | POA: Diagnosis not present

## 2018-05-04 DIAGNOSIS — H6122 Impacted cerumen, left ear: Secondary | ICD-10-CM | POA: Diagnosis not present

## 2018-05-04 DIAGNOSIS — H7202 Central perforation of tympanic membrane, left ear: Secondary | ICD-10-CM | POA: Diagnosis not present

## 2018-05-06 DIAGNOSIS — M542 Cervicalgia: Secondary | ICD-10-CM | POA: Diagnosis not present

## 2018-05-06 DIAGNOSIS — J301 Allergic rhinitis due to pollen: Secondary | ICD-10-CM | POA: Diagnosis not present

## 2018-05-06 DIAGNOSIS — M50322 Other cervical disc degeneration at C5-C6 level: Secondary | ICD-10-CM | POA: Diagnosis not present

## 2018-05-10 DIAGNOSIS — M542 Cervicalgia: Secondary | ICD-10-CM | POA: Diagnosis not present

## 2018-05-10 DIAGNOSIS — M50322 Other cervical disc degeneration at C5-C6 level: Secondary | ICD-10-CM | POA: Diagnosis not present

## 2018-05-12 DIAGNOSIS — J301 Allergic rhinitis due to pollen: Secondary | ICD-10-CM | POA: Diagnosis not present

## 2018-05-13 DIAGNOSIS — M542 Cervicalgia: Secondary | ICD-10-CM | POA: Diagnosis not present

## 2018-05-13 DIAGNOSIS — M50322 Other cervical disc degeneration at C5-C6 level: Secondary | ICD-10-CM | POA: Diagnosis not present

## 2018-05-18 ENCOUNTER — Ambulatory Visit (INDEPENDENT_AMBULATORY_CARE_PROVIDER_SITE_OTHER): Payer: PPO | Admitting: Unknown Physician Specialty

## 2018-05-18 ENCOUNTER — Encounter: Payer: Self-pay | Admitting: Unknown Physician Specialty

## 2018-05-18 VITALS — BP 114/73 | HR 79 | Temp 98.0°F | Ht 67.0 in | Wt 147.0 lb

## 2018-05-18 DIAGNOSIS — F5101 Primary insomnia: Secondary | ICD-10-CM

## 2018-05-18 DIAGNOSIS — Z1211 Encounter for screening for malignant neoplasm of colon: Secondary | ICD-10-CM | POA: Diagnosis not present

## 2018-05-18 DIAGNOSIS — F419 Anxiety disorder, unspecified: Secondary | ICD-10-CM | POA: Diagnosis not present

## 2018-05-18 DIAGNOSIS — E2839 Other primary ovarian failure: Secondary | ICD-10-CM

## 2018-05-18 MED ORDER — ALPRAZOLAM 0.5 MG PO TABS: 0.5000 mg | ORAL_TABLET | Freq: Every evening | ORAL | 2 refills | Status: DC | PRN

## 2018-05-18 NOTE — Assessment & Plan Note (Signed)
Takes Xanax on most days.  Wrote rx's for 3 months.  I did not ask her to fill out a pain contract as she wants to talk to her neuro-surgeon about back pain management as this is something we do not want to take over.

## 2018-05-18 NOTE — Patient Instructions (Addendum)
Please do call to schedule your bone density; the number to schedule one at either [facility] or [facility] is [phone]   
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
## 2018-05-18 NOTE — Assessment & Plan Note (Signed)
Uses the Xanax to help her sleep.

## 2018-05-20 DIAGNOSIS — J301 Allergic rhinitis due to pollen: Secondary | ICD-10-CM | POA: Diagnosis not present

## 2018-05-20 DIAGNOSIS — M546 Pain in thoracic spine: Secondary | ICD-10-CM | POA: Diagnosis not present

## 2018-05-20 DIAGNOSIS — S335XXA Sprain of ligaments of lumbar spine, initial encounter: Secondary | ICD-10-CM | POA: Diagnosis not present

## 2018-05-26 DIAGNOSIS — J301 Allergic rhinitis due to pollen: Secondary | ICD-10-CM | POA: Diagnosis not present

## 2018-06-02 DIAGNOSIS — J301 Allergic rhinitis due to pollen: Secondary | ICD-10-CM | POA: Diagnosis not present

## 2018-06-04 DIAGNOSIS — J029 Acute pharyngitis, unspecified: Secondary | ICD-10-CM | POA: Diagnosis not present

## 2018-06-04 DIAGNOSIS — J019 Acute sinusitis, unspecified: Secondary | ICD-10-CM | POA: Diagnosis not present

## 2018-06-04 DIAGNOSIS — B9689 Other specified bacterial agents as the cause of diseases classified elsewhere: Secondary | ICD-10-CM | POA: Diagnosis not present

## 2018-06-04 DIAGNOSIS — K219 Gastro-esophageal reflux disease without esophagitis: Secondary | ICD-10-CM | POA: Diagnosis not present

## 2018-06-10 DIAGNOSIS — J301 Allergic rhinitis due to pollen: Secondary | ICD-10-CM | POA: Diagnosis not present

## 2018-06-17 DIAGNOSIS — J301 Allergic rhinitis due to pollen: Secondary | ICD-10-CM | POA: Diagnosis not present

## 2018-06-23 ENCOUNTER — Other Ambulatory Visit: Payer: Self-pay

## 2018-06-23 ENCOUNTER — Telehealth: Payer: Self-pay | Admitting: Obstetrics & Gynecology

## 2018-06-23 DIAGNOSIS — J301 Allergic rhinitis due to pollen: Secondary | ICD-10-CM | POA: Diagnosis not present

## 2018-06-23 DIAGNOSIS — N952 Postmenopausal atrophic vaginitis: Secondary | ICD-10-CM

## 2018-06-23 MED ORDER — ESTRIOL 10 % CREA: 1.5000 g | TOPICAL_CREAM | 12 refills | Status: DC

## 2018-06-23 NOTE — Telephone Encounter (Signed)
Patient is calling due to her pharmacy stating they haven't received an refill on her medication. Patient can see thru her mychart that she does have refill. Please advise

## 2018-06-30 DIAGNOSIS — J300 Vasomotor rhinitis: Secondary | ICD-10-CM | POA: Diagnosis not present

## 2018-06-30 DIAGNOSIS — R05 Cough: Secondary | ICD-10-CM | POA: Diagnosis not present

## 2018-06-30 DIAGNOSIS — H7202 Central perforation of tympanic membrane, left ear: Secondary | ICD-10-CM | POA: Diagnosis not present

## 2018-06-30 DIAGNOSIS — H6123 Impacted cerumen, bilateral: Secondary | ICD-10-CM | POA: Diagnosis not present

## 2018-06-30 DIAGNOSIS — J301 Allergic rhinitis due to pollen: Secondary | ICD-10-CM | POA: Diagnosis not present

## 2018-07-05 DIAGNOSIS — J301 Allergic rhinitis due to pollen: Secondary | ICD-10-CM | POA: Diagnosis not present

## 2018-07-08 ENCOUNTER — Telehealth: Payer: Self-pay

## 2018-07-08 ENCOUNTER — Other Ambulatory Visit: Payer: Self-pay | Admitting: Obstetrics & Gynecology

## 2018-07-08 DIAGNOSIS — N952 Postmenopausal atrophic vaginitis: Secondary | ICD-10-CM

## 2018-07-08 NOTE — Telephone Encounter (Addendum)
Done. Hailey Wall to patient and informed. Pt requested to cancel appt for 07/15. States appt was just to get Rx corrected.

## 2018-07-08 NOTE — Telephone Encounter (Signed)
Pt left vmail on 07/11 @8 :14 stating she needed a medication adjustment on Estriol 10% cream. Spoke to pt. She states Rx changed from 0.1% to 10% when pharmacy changed from [place] to [place]. Unable to tolerate 10%.  C/O causing severe breast soreness and tenderness. Requesting to be placed back on Estriol 0.1% 1g 3 times weekly with increased dosage of 5 clicks. Pt has an OV appt on 07/15.

## 2018-07-08 NOTE — Telephone Encounter (Signed)
Please call her pharamist, and verbal order the change (as I cannot find this percent in Epic, as it is a compounded formulation). Estriol 0.1% cream, apply 1 g per vagina 3 times weekly.  #12g.  RF 11. Thx

## 2018-07-09 DIAGNOSIS — J301 Allergic rhinitis due to pollen: Secondary | ICD-10-CM | POA: Diagnosis not present

## 2018-07-12 ENCOUNTER — Ambulatory Visit: Payer: PPO | Admitting: Obstetrics & Gynecology

## 2018-07-15 DIAGNOSIS — J301 Allergic rhinitis due to pollen: Secondary | ICD-10-CM | POA: Diagnosis not present

## 2018-07-19 ENCOUNTER — Other Ambulatory Visit: Payer: PPO

## 2018-07-19 DIAGNOSIS — S8001XA Contusion of right knee, initial encounter: Secondary | ICD-10-CM | POA: Diagnosis not present

## 2018-07-19 DIAGNOSIS — M25561 Pain in right knee: Secondary | ICD-10-CM | POA: Diagnosis not present

## 2018-07-19 DIAGNOSIS — S93402A Sprain of unspecified ligament of left ankle, initial encounter: Secondary | ICD-10-CM | POA: Diagnosis not present

## 2018-07-19 DIAGNOSIS — M25572 Pain in left ankle and joints of left foot: Secondary | ICD-10-CM | POA: Diagnosis not present

## 2018-07-21 ENCOUNTER — Encounter: Payer: PPO | Admitting: Unknown Physician Specialty

## 2018-07-21 ENCOUNTER — Other Ambulatory Visit: Payer: PPO

## 2018-07-22 DIAGNOSIS — J301 Allergic rhinitis due to pollen: Secondary | ICD-10-CM | POA: Diagnosis not present

## 2018-07-26 ENCOUNTER — Ambulatory Visit: Payer: PPO

## 2018-07-26 ENCOUNTER — Encounter: Payer: PPO | Admitting: Unknown Physician Specialty

## 2018-07-28 ENCOUNTER — Telehealth: Payer: Self-pay | Admitting: Unknown Physician Specialty

## 2018-07-28 ENCOUNTER — Ambulatory Visit (INDEPENDENT_AMBULATORY_CARE_PROVIDER_SITE_OTHER): Payer: PPO | Admitting: Unknown Physician Specialty

## 2018-07-28 ENCOUNTER — Encounter: Payer: Self-pay | Admitting: Unknown Physician Specialty

## 2018-07-28 ENCOUNTER — Ambulatory Visit
Admission: RE | Admit: 2018-07-28 | Discharge: 2018-07-28 | Disposition: A | Discharge status: Home / Self Care | Payer: PPO | Source: Ambulatory Visit | Attending: Unknown Physician Specialty | Admitting: Unknown Physician Specialty

## 2018-07-28 VITALS — BP 119/78 | HR 72 | Temp 98.1°F | Ht 66.5 in | Wt 149.6 lb

## 2018-07-28 DIAGNOSIS — M542 Cervicalgia: Secondary | ICD-10-CM | POA: Diagnosis not present

## 2018-07-28 DIAGNOSIS — Z7189 Other specified counseling: Secondary | ICD-10-CM

## 2018-07-28 DIAGNOSIS — E2839 Other primary ovarian failure: Secondary | ICD-10-CM | POA: Diagnosis not present

## 2018-07-28 DIAGNOSIS — Z780 Asymptomatic menopausal state: Secondary | ICD-10-CM | POA: Diagnosis not present

## 2018-07-28 DIAGNOSIS — I10 Essential (primary) hypertension: Secondary | ICD-10-CM | POA: Diagnosis not present

## 2018-07-28 DIAGNOSIS — R5383 Other fatigue: Secondary | ICD-10-CM

## 2018-07-28 DIAGNOSIS — L659 Nonscarring hair loss, unspecified: Secondary | ICD-10-CM | POA: Diagnosis not present

## 2018-07-28 DIAGNOSIS — Z1231 Encounter for screening mammogram for malignant neoplasm of breast: Secondary | ICD-10-CM | POA: Diagnosis not present

## 2018-07-28 DIAGNOSIS — Z1239 Encounter for other screening for malignant neoplasm of breast: Secondary | ICD-10-CM

## 2018-07-28 DIAGNOSIS — J301 Allergic rhinitis due to pollen: Secondary | ICD-10-CM | POA: Diagnosis not present

## 2018-07-28 DIAGNOSIS — F419 Anxiety disorder, unspecified: Secondary | ICD-10-CM

## 2018-07-28 DIAGNOSIS — M810 Age-related osteoporosis without current pathological fracture: Secondary | ICD-10-CM | POA: Diagnosis not present

## 2018-07-28 MED ORDER — ALENDRONATE SODIUM 70 MG PO TABS: 70.0000 mg | ORAL_TABLET | ORAL | 3 refills | Status: AC
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
## 2018-07-28 NOTE — Telephone Encounter (Signed)
She called back and started her on Fosamax

## 2018-07-28 NOTE — Telephone Encounter (Signed)
No answer.  Left message.  She has Osteoporosis.  She has 2 options, start weekly Alendronate or go to Endocrine for further discussion of options.

## 2018-07-28 NOTE — Telephone Encounter (Signed)
Would be happy to!

## 2018-07-28 NOTE — Assessment & Plan Note (Signed)
Treated with Tramadol and Baclofen through neurology

## 2018-07-28 NOTE — Assessment & Plan Note (Addendum)
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  She is not interested in filling out an advance directive.  She doesn't believe in "pulling the plug."  However, she does feel that if she has a terminal illness she would like to be able to go home.  Her HCPOA is husband with niece and granddaughter is secondary.  She will bring in a form when she changes medical practices

## 2018-07-28 NOTE — Assessment & Plan Note (Addendum)
History of.  Diet controlled
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
## 2018-07-28 NOTE — Telephone Encounter (Signed)
Discussed with pt.  Will start Fosamax 70 mg weekly

## 2018-07-29 LAB — CBC WITH DIFFERENTIAL/PLATELET
BASOS: 0 %
Basophils Absolute: 0 10*3/uL (ref 0.0–0.2)
EOS (ABSOLUTE): 0.1 10*3/uL (ref 0.0–0.4)
EOS: 2 %
HEMATOCRIT: 40.6 % (ref 34.0–46.6)
Hemoglobin: 12.9 g/dL (ref 11.1–15.9)
IMMATURE GRANULOCYTES: 0 %
Immature Grans (Abs): 0 10*3/uL (ref 0.0–0.1)
LYMPHS: 35 %
Lymphocytes Absolute: 2.1 10*3/uL (ref 0.7–3.1)
MCH: 29.5 pg (ref 26.6–33.0)
MCHC: 31.8 g/dL (ref 31.5–35.7)
MCV: 93 fL (ref 79–97)
Monocytes Absolute: 0.6 10*3/uL (ref 0.1–0.9)
Monocytes: 10 %
NEUTROS PCT: 53 %
Neutrophils Absolute: 3.1 10*3/uL (ref 1.4–7.0)
PLATELETS: 213 10*3/uL (ref 150–450)
RBC: 4.37 x10E6/uL (ref 3.77–5.28)
RDW: 12.7 % (ref 12.3–15.4)
WBC: 6 10*3/uL (ref 3.4–10.8)

## 2018-07-29 LAB — COMPREHENSIVE METABOLIC PANEL
A/G RATIO: 1.8 (ref 1.2–2.2)
ALK PHOS: 80 IU/L (ref 39–117)
ALT: 18 IU/L (ref 0–32)
AST: 22 IU/L (ref 0–40)
Albumin: 4.5 g/dL (ref 3.5–4.8)
BUN/Creatinine Ratio: 28 (ref 12–28)
BUN: 19 mg/dL (ref 8–27)
Bilirubin Total: 0.4 mg/dL (ref 0.0–1.2)
CHLORIDE: 103 mmol/L (ref 96–106)
CO2: 23 mmol/L (ref 20–29)
Calcium: 9.2 mg/dL (ref 8.7–10.3)
Creatinine, Ser: 0.67 mg/dL (ref 0.57–1.00)
GFR calc Af Amer: 103 mL/min/{1.73_m2} (ref >59)
GFR calc non Af Amer: 89 mL/min/{1.73_m2} (ref >59)
Globulin, Total: 2.5 g/dL (ref 1.5–4.5)
Glucose: 95 mg/dL (ref 65–99)
POTASSIUM: 4.9 mmol/L (ref 3.5–5.2)
SODIUM: 142 mmol/L (ref 134–144)
Total Protein: 7 g/dL (ref 6.0–8.5)

## 2018-07-29 LAB — LIPID PANEL W/O CHOL/HDL RATIO
CHOLESTEROL TOTAL: 242 mg/dL — AB (ref 100–199)
HDL: 96 mg/dL (ref >39)
LDL Calculated: 115 mg/dL — ABNORMAL HIGH (ref 0–99)
Triglycerides: 154 mg/dL — ABNORMAL HIGH (ref 0–149)
VLDL Cholesterol Cal: 31 mg/dL (ref 5–40)

## 2018-07-29 LAB — TSH: TSH: 2.5 u[IU]/mL (ref 0.450–4.500)

## 2018-08-05 DIAGNOSIS — J301 Allergic rhinitis due to pollen: Secondary | ICD-10-CM | POA: Diagnosis not present

## 2018-08-12 DIAGNOSIS — J301 Allergic rhinitis due to pollen: Secondary | ICD-10-CM | POA: Diagnosis not present

## 2018-08-19 DIAGNOSIS — J301 Allergic rhinitis due to pollen: Secondary | ICD-10-CM | POA: Diagnosis not present

## 2018-08-23 ENCOUNTER — Ambulatory Visit: Payer: PPO | Admitting: Unknown Physician Specialty

## 2018-08-23 ENCOUNTER — Ambulatory Visit (INDEPENDENT_AMBULATORY_CARE_PROVIDER_SITE_OTHER): Payer: PPO | Admitting: Physician Assistant

## 2018-08-23 ENCOUNTER — Ambulatory Visit: Payer: PPO | Admitting: Physician Assistant

## 2018-08-23 ENCOUNTER — Encounter: Payer: Self-pay | Admitting: Physician Assistant

## 2018-08-23 VITALS — BP 106/68 | HR 83 | Wt 151.0 lb

## 2018-08-23 DIAGNOSIS — E781 Pure hyperglyceridemia: Secondary | ICD-10-CM

## 2018-08-23 DIAGNOSIS — F419 Anxiety disorder, unspecified: Secondary | ICD-10-CM | POA: Diagnosis not present
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
## 2018-08-24 ENCOUNTER — Telehealth: Payer: Self-pay | Admitting: Unknown Physician Specialty

## 2018-08-24 DIAGNOSIS — F419 Anxiety disorder, unspecified: Secondary | ICD-10-CM

## 2018-08-24 MED ORDER — ALPRAZOLAM 0.25 MG PO TABS
0.2500 mg | ORAL_TABLET | Freq: Every day | ORAL | 0 refills | Status: DC
Start: 2018-08-24 — End: 2018-08-25

## 2018-08-24 NOTE — Telephone Encounter (Signed)
Gave 0.25 mg QHS x 1 week as we talked about reducing to 1/2 tab due to morning grogginess. Will forward remaining prescription to Kathrine Haddock, NP.

## 2018-08-24 NOTE — Telephone Encounter (Signed)
Copied from [place] [phone]. Topic: General - Other >> Aug 24, 2018 11:27 AM Lennox Solders wrote: Reason for CRM: Pt is calling and needs a refill on alprazolam [facility] [address]. Pt has an appt in nov

## 2018-08-25 MED ORDER — ALPRAZOLAM 0.5 MG PO TABS: 0.5000 mg | ORAL_TABLET | Freq: Every evening | ORAL | 2 refills | Status: DC | PRN

## 2018-08-25 NOTE — Telephone Encounter (Signed)
Please see my message below. I gave 7 days of 0.25 mg xanax and the rest I will forward to [place] for her review.

## 2018-08-25 NOTE — Telephone Encounter (Signed)
Left message on machine for pt to return call to the office. CRM Created.

## 2018-08-25 NOTE — Telephone Encounter (Signed)
Called and relayed message to patient. She stated that was not what she was trying say at last visit. Patient states that she meant she just needed to take it earlier in the evening. Pt then stated 0.25 mg do nothing for her and that she then had to take a half of a half tablet this morning just to reduce the nausea. Patient was irate and yelling. Attempted to calm patient down, patient stated, "I'll just talk to Rache l tomorrow and get my medicine fixed".

## 2018-08-25 NOTE — Telephone Encounter (Signed)
I will fill this.  She should come in in 3 months.

## 2018-08-25 NOTE — Telephone Encounter (Signed)
Patient calling checking status

## 2018-08-25 NOTE — Telephone Encounter (Signed)
Spoke with Kathrine Haddock, NP who is willing to fill 0.5 mg Xanax QHS. She is to be seen in  Follow up in three months. Please take her off Rachel's schedule.

## 2018-08-25 NOTE — Patient Instructions (Signed)

## 2018-08-25 NOTE — Addendum Note (Signed)
Addended by: Kathrine Haddock on: 08/25/2018 10:49 AM   Modules accepted: Orders

## 2018-08-26 ENCOUNTER — Encounter: Payer: Self-pay | Admitting: Family Medicine

## 2018-08-26 ENCOUNTER — Ambulatory Visit (INDEPENDENT_AMBULATORY_CARE_PROVIDER_SITE_OTHER): Payer: PPO | Admitting: Family Medicine

## 2018-08-26 VITALS — BP 130/70 | HR 75 | Temp 98.1°F | Ht 66.5 in | Wt 148.6 lb

## 2018-08-26 DIAGNOSIS — F419 Anxiety disorder, unspecified: Secondary | ICD-10-CM

## 2018-08-26 DIAGNOSIS — I10 Essential (primary) hypertension: Secondary | ICD-10-CM

## 2018-08-26 DIAGNOSIS — F5101 Primary insomnia: Secondary | ICD-10-CM | POA: Diagnosis not present

## 2018-08-26 DIAGNOSIS — J301 Allergic rhinitis due to pollen: Secondary | ICD-10-CM | POA: Diagnosis not present

## 2018-08-26 NOTE — Progress Notes (Signed)
BP 130/70   Pulse 75   Temp 98.1 F (36.7 C) (Oral)   Ht 5' 6.5" (1.689 m)   Wt 148 lb 9.6 oz (67.4 kg)   LMP  (LMP Unknown)   SpO2 98%   BMI 23.63 kg/m    Subjective:    Patient ID: Hailey Wall, female    DOB: 09/02/1946, 72 y.o.   MRN: [id number]  HPI: Hailey Wall is a 72 y.o. female  Chief Complaint  Patient presents with  . Anxiety    medication f/u   Here today hoping to transfer care and to discuss her anxiety treatment. Has been on xanax daily for about 35 years for chronic anxiety and insomnia and adamant that this is the only thing that has ever worked for her. Has been to Psychiatry in the past where she tried and failed wellbutrin and paxil but does not remember why. Not interested in trying any other medications, only wanting xanax. Very upset about her visit 2 days ago where safety concerns regarding her age and monotherapy usage of chronic benzos was discussed. She does not feel that her medication should be changed just because her provider has left and would like her regular refill today.   Also concerned about her BP due to the stress of this situation. States it's up higher than it usually is. Denies Cp, HAs, syncope, SOB. Not currently on any BP medications, lifestyle managed.   GAD 7 : Generalized Anxiety Score 08/26/2018 08/23/2018 12/03/2017  Nervous, Anxious, on Edge 0 0 0  Control/stop worrying 0 0 0  Worry too much - different things 0 0 0  Trouble relaxing 0 0 0  Restless 0 0 1  Easily annoyed or irritable 0 0 0  Afraid - awful might happen 0 0 0  Total GAD 7 Score 0 0 1  Anxiety Difficulty - Not difficult at all -    Relevant past medical, surgical, family and social history reviewed and updated as indicated. Interim medical history since our last visit reviewed. Allergies and medications reviewed and updated.  Review of Systems  Per HPI unless specifically indicated above     Objective:    BP 130/70   Pulse 75   Temp 98.1 F  (36.7 C) (Oral)   Ht 5' 6.5" (1.689 m)   Wt 148 lb 9.6 oz (67.4 kg)   LMP  (LMP Unknown)   SpO2 98%   BMI 23.63 kg/m   Wt Readings from Last 3 Encounters:  08/26/18 148 lb 9.6 oz (67.4 kg)  08/23/18 151 lb (68.5 kg)  07/28/18 149 lb 9.6 oz (67.9 kg)    Physical Exam  Constitutional: She is oriented to person, place, and time. She appears well-developed and well-nourished.  HENT:  Head: Atraumatic.  Eyes: Conjunctivae and EOM are normal.  Neck: Normal range of motion. Neck supple.  Cardiovascular: Normal rate, regular rhythm and normal heart sounds.  Pulmonary/Chest: Effort normal and breath sounds normal.  Musculoskeletal: Normal range of motion. She exhibits no edema.  Neurological: She is alert and oriented to person, place, and time.  Skin: Skin is warm and dry.  Psychiatric:  Quite agitated and argumentative during entire visit  Nursing note and vitals reviewed.   Results for orders placed or performed in visit on 07/28/18  Comprehensive metabolic panel  Result Value Ref Range   Glucose 95 65 - 99 mg/dL   BUN 19 8 - 27 mg/dL   Creatinine, Ser 0.67 0.57 - 1.00  mg/dL   GFR calc non Af Amer 89 >59 mL/min/1.73   GFR calc Af Amer 103 >59 mL/min/1.73   BUN/Creatinine Ratio 28 12 - 28   Sodium 142 134 - 144 mmol/L   Potassium 4.9 3.5 - 5.2 mmol/L   Chloride 103 96 - 106 mmol/L   CO2 23 20 - 29 mmol/L   Calcium 9.2 8.7 - 10.3 mg/dL   Total Protein 7.0 6.0 - 8.5 g/dL   Albumin 4.5 3.5 - 4.8 g/dL   Globulin, Total 2.5 1.5 - 4.5 g/dL   Albumin/Globulin Ratio 1.8 1.2 - 2.2   Bilirubin Total 0.4 0.0 - 1.2 mg/dL   Alkaline Phosphatase 80 39 - 117 IU/L   AST 22 0 - 40 IU/L   ALT 18 0 - 32 IU/L  Lipid Panel w/o Chol/HDL Ratio  Result Value Ref Range   Cholesterol, Total 242 (H) 100 - 199 mg/dL   Triglycerides 154 (H) 0 - 149 mg/dL   HDL 96 >39 mg/dL   VLDL Cholesterol Cal 31 5 - 40 mg/dL   LDL Calculated 115 (H) 0 - 99 mg/dL  TSH  Result Value Ref Range   TSH 2.500  0.450 - 4.500 uIU/mL  CBC with Differential/Platelet  Result Value Ref Range   WBC 6.0 3.4 - 10.8 x10E3/uL   RBC 4.37 3.77 - 5.28 x10E6/uL   Hemoglobin 12.9 11.1 - 15.9 g/dL   Hematocrit 40.6 34.0 - 46.6 %   MCV 93 79 - 97 fL   MCH 29.5 26.6 - 33.0 pg   MCHC 31.8 31.5 - 35.7 g/dL   RDW 12.7 12.3 - 15.4 %   Platelets 213 150 - 450 x10E3/uL   Neutrophils 53 Not Estab. %   Lymphs 35 Not Estab. %   Monocytes 10 Not Estab. %   Eos 2 Not Estab. %   Basos 0 Not Estab. %   Neutrophils Absolute 3.1 1.4 - 7.0 x10E3/uL   Lymphocytes Absolute 2.1 0.7 - 3.1 x10E3/uL   Monocytes Absolute 0.6 0.1 - 0.9 x10E3/uL   EOS (ABSOLUTE) 0.1 0.0 - 0.4 x10E3/uL   Basophils Absolute 0.0 0.0 - 0.2 x10E3/uL   Immature Granulocytes 0 Not Estab. %   Immature Grans (Abs) 0.0 0.0 - 0.1 x10E3/uL      Assessment & Plan:   Problem List Items Addressed This Visit      Cardiovascular and Mediastinum   Hypertension - Primary    Reassurance given that BPs remain stable and WNL. Continue current regimen with lifestyle control        Other   Insomnia    Attempted to have a discussion about some safer alternatives for treatment than xanax, pt became very agitated and argumentative at this and refused all other options.       Chronic anxiety    Greater than 25 minutes spent in firm but empathetic counseling with patient regarding validity of safety concerns with her chronic use of xanax as monotherapy and her resistance to trying safer options. Worth noting, pt GAD 7 scores the past 2 times (including today) have been 0 despite subjective severe stress and anxiety. Pt becomes even more agitated when she learns I will not continue her regimen without adding supportive medications and begins lashing out at me that I don't care about her and I'm not listening to her and that she should get the medication she needs regardless of who she sees. Offered referral to Psychiatry to discuss further management, pt refused many  times.  Discussed that I wasn't sure how the new provider coming on felt about her current treatment situation but that I was not comfortable leaving her on current regimen as it is. She states she will consider transferring PCPs to [place] because this is unacceptable. In meantime, her previous PCP has just filled a 3 month supply for her until she can get situated somewhere else as she intends to do,           Follow up plan: Return if symptoms worsen or fail to improve.

## 2018-08-31 NOTE — Assessment & Plan Note (Addendum)
Greater than 25 minutes spent in firm but empathetic counseling with patient regarding validity of safety concerns with her chronic use of xanax as monotherapy and her resistance to trying safer options. Worth noting, pt GAD 7 scores the past 2 times (including today) have been 0 despite subjective severe stress and anxiety. Pt becomes even more agitated when she learns I will not continue her regimen without adding supportive medications and begins lashing out at me that I don't care about her and I'm not listening to her and that she should get the medication she needs regardless of who she sees. Offered referral to Psychiatry to discuss further management, pt refused many times. Discussed that I wasn't sure how the new provider coming on felt about her current treatment situation but that I was not comfortable leaving her on current regimen as it is. She states she will consider transferring PCPs to [place] because this is unacceptable. In meantime, her previous PCP has just filled a 3 month supply for her until she can get situated somewhere else as she intends to do,

## 2018-08-31 NOTE — Patient Instructions (Signed)
Follow up as needed

## 2018-08-31 NOTE — Assessment & Plan Note (Signed)
Reassurance given that BPs remain stable and WNL. Continue current regimen with lifestyle control

## 2018-08-31 NOTE — Telephone Encounter (Signed)
error 

## 2018-08-31 NOTE — Assessment & Plan Note (Signed)
Attempted to have a discussion about some safer alternatives for treatment than xanax, pt became very agitated and argumentative at this and refused all other options.

## 2018-09-01 DIAGNOSIS — H7202 Central perforation of tympanic membrane, left ear: Secondary | ICD-10-CM | POA: Diagnosis not present

## 2018-09-01 DIAGNOSIS — H6122 Impacted cerumen, left ear: Secondary | ICD-10-CM | POA: Diagnosis not present

## 2018-09-01 DIAGNOSIS — R05 Cough: Secondary | ICD-10-CM | POA: Diagnosis not present

## 2018-09-06 DIAGNOSIS — J301 Allergic rhinitis due to pollen: Secondary | ICD-10-CM | POA: Diagnosis not present

## 2018-09-13 DIAGNOSIS — J301 Allergic rhinitis due to pollen: Secondary | ICD-10-CM | POA: Diagnosis not present

## 2018-09-20 DIAGNOSIS — J301 Allergic rhinitis due to pollen: Secondary | ICD-10-CM | POA: Diagnosis not present

## 2018-09-27 DIAGNOSIS — J301 Allergic rhinitis due to pollen: Secondary | ICD-10-CM | POA: Diagnosis not present

## 2018-09-29 ENCOUNTER — Ambulatory Visit
Admission: RE | Admit: 2018-09-29 | Discharge: 2018-09-29 | Disposition: A | Discharge status: Home / Self Care | Payer: PPO | Source: Ambulatory Visit | Attending: Unknown Physician Specialty | Admitting: Unknown Physician Specialty

## 2018-09-29 DIAGNOSIS — J301 Allergic rhinitis due to pollen: Secondary | ICD-10-CM | POA: Diagnosis not present

## 2018-09-29 DIAGNOSIS — Z1239 Encounter for other screening for malignant neoplasm of breast: Secondary | ICD-10-CM

## 2018-09-29 DIAGNOSIS — Z1231 Encounter for screening mammogram for malignant neoplasm of breast: Secondary | ICD-10-CM | POA: Diagnosis not present

## 2018-10-04 ENCOUNTER — Telehealth: Payer: Self-pay

## 2018-10-04 NOTE — Telephone Encounter (Signed)
Copied from [place] [phone]. Topic: General - Other >> Oct 01, 2018 12:28 PM Valla Leaver wrote: Reason for CRM: Call back to discuss mammo results   Called and spoke with patient. I let her know that according to results and letter, mammogram was normal. Screening mammogram recommended in one year.

## 2018-10-07 DIAGNOSIS — J301 Allergic rhinitis due to pollen: Secondary | ICD-10-CM | POA: Diagnosis not present

## 2018-10-14 DIAGNOSIS — J301 Allergic rhinitis due to pollen: Secondary | ICD-10-CM | POA: Diagnosis not present

## 2018-10-18 DIAGNOSIS — J301 Allergic rhinitis due to pollen: Secondary | ICD-10-CM | POA: Diagnosis not present

## 2018-10-25 DIAGNOSIS — J301 Allergic rhinitis due to pollen: Secondary | ICD-10-CM | POA: Diagnosis not present

## 2018-11-01 DIAGNOSIS — J301 Allergic rhinitis due to pollen: Secondary | ICD-10-CM | POA: Diagnosis not present

## 2018-11-08 DIAGNOSIS — J301 Allergic rhinitis due to pollen: Secondary | ICD-10-CM | POA: Diagnosis not present

## 2018-11-09 DIAGNOSIS — H6062 Unspecified chronic otitis externa, left ear: Secondary | ICD-10-CM | POA: Diagnosis not present

## 2018-11-09 DIAGNOSIS — H6122 Impacted cerumen, left ear: Secondary | ICD-10-CM | POA: Diagnosis not present

## 2018-11-09 DIAGNOSIS — K219 Gastro-esophageal reflux disease without esophagitis: Secondary | ICD-10-CM | POA: Diagnosis not present

## 2018-11-22 DIAGNOSIS — J301 Allergic rhinitis due to pollen: Secondary | ICD-10-CM | POA: Diagnosis not present

## 2018-11-23 ENCOUNTER — Ambulatory Visit: Payer: PPO | Admitting: Nurse Practitioner

## 2018-11-26 ENCOUNTER — Ambulatory Visit (INDEPENDENT_AMBULATORY_CARE_PROVIDER_SITE_OTHER): Payer: PPO | Admitting: Nurse Practitioner

## 2018-11-26 ENCOUNTER — Encounter: Payer: Self-pay | Admitting: Nurse Practitioner

## 2018-11-26 VITALS — BP 133/84 | HR 78 | Temp 98.2°F | Wt 153.6 lb

## 2018-11-26 DIAGNOSIS — F4312 Post-traumatic stress disorder, chronic: Secondary | ICD-10-CM

## 2018-11-26 NOTE — Assessment & Plan Note (Addendum)
Will maintain off Xanax at this time, per patient wishes.  Educated her on BEERS criteria and benefit of maintaining off benzo.  Discussed alternate medication options, such as SSRI, and therapy.  She is interested in therapy and wishes to discuss with her insurance company coverage.  At this time she does not wish to start new medication.  Consider SSRI in future.
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
## 2018-11-26 NOTE — Patient Instructions (Addendum)
Living With Anxiety After being diagnosed with an anxiety disorder, you may be relieved to know why you have felt or behaved a certain way. It is natural to also feel overwhelmed about the treatment ahead and what it will mean for your life. With care and support, you can manage this condition and recover from it. How to cope with anxiety Dealing with stress Stress is your body's reaction to life changes and events, both good and bad. Stress can last just a few hours or it can be ongoing. Stress can play a major role in anxiety, so it is important to learn both how to cope with stress and how to think about it differently. Talk with your health care provider or a counselor to learn more about stress reduction. He or she may suggest some stress reduction techniques, such as:  Music therapy. This can include creating or listening to music that you enjoy and that inspires you.  Mindfulness-based meditation. This involves being aware of your normal breaths, rather than trying to control your breathing. It can be done while sitting or walking.  Centering prayer. This is a kind of meditation that involves focusing on a word, phrase, or sacred image that is meaningful to you and that brings you peace.  Deep breathing. To do this, expand your stomach and inhale slowly through your nose. Hold your breath for 3-5 seconds. Then exhale slowly, allowing your stomach muscles to relax.  Self-talk. This is a skill where you identify thought patterns that lead to anxiety reactions and correct those thoughts.  Muscle relaxation. This involves tensing muscles then relaxing them.  Choose a stress reduction technique that fits your lifestyle and personality. Stress reduction techniques take time and practice. Set aside 5-15 minutes a day to do them. Therapists can offer training in these techniques. The training may be covered by some insurance plans. Other things you can do to manage stress include:  Keeping a  stress diary. This can help you learn what triggers your stress and ways to control your response.  Thinking about how you respond to certain situations. You may not be able to control everything, but you can control your reaction.  Making time for activities that help you relax, and not feeling guilty about spending your time in this way.  Therapy combined with coping and stress-reduction skills provides the best chance for successful treatment. Medicines Medicines can help ease symptoms. Medicines for anxiety include:  Anti-anxiety drugs.  Antidepressants.  Beta-blockers.  Medicines may be used as the main treatment for anxiety disorder, along with therapy, or if other treatments are not working. Medicines should be prescribed by a health care provider. Relationships Relationships can play a big part in helping you recover. Try to spend more time connecting with trusted friends and family members. Consider going to couples counseling, taking family education classes, or going to family therapy. Therapy can help you and others better understand the condition. How to recognize changes in your condition Everyone has a different response to treatment for anxiety. Recovery from anxiety happens when symptoms decrease and stop interfering with your daily activities at home or work. This may mean that you will start to:  Have better concentration and focus.  Sleep better.  Be less irritable.  Have more energy.  Have improved memory.  It is important to recognize when your condition is getting worse. Contact your health care provider if your symptoms interfere with home or work and you do not feel like your condition   is improving. Where to find help and support: You can get help and support from these sources:  Self-help groups.  Online and [employer].  A trusted spiritual leader.  Couples counseling.  Family education classes.  Family therapy.  Follow these  instructions at home:  Eat a healthy diet that includes plenty of vegetables, fruits, whole grains, low-fat dairy products, and lean protein. Do not eat a lot of foods that are high in solid fats, added sugars, or salt.  Exercise. Most adults should do the following: ? Exercise for at least 150 minutes each week. The exercise should increase your heart rate and make you sweat (moderate-intensity exercise). ? Strengthening exercises at least twice a week.  Cut down on caffeine, tobacco, alcohol, and other potentially harmful substances.  Get the right amount and quality of sleep. Most adults need 7-9 hours of sleep each night.  Make choices that simplify your life.  Take over-the-counter and prescription medicines only as told by your health care provider.  Avoid caffeine, alcohol, and certain over-the-counter cold medicines. These may make you feel worse. Ask your pharmacist which medicines to avoid.  Keep all follow-up visits as told by your health care provider. This is important. Questions to ask your health care provider  Would I benefit from therapy?  How often should I follow up with a health care provider?  How long do I need to take medicine?  Are there any long-term side effects of my medicine?  Are there any alternatives to taking medicine? Contact a health care provider if:  You have a hard time staying focused or finishing daily tasks.  You spend many hours a day feeling worried about everyday life.  You become exhausted by worry.  You start to have headaches, feel tense, or have nausea.  You urinate more than normal.  You have diarrhea. Get help right away if:  You have a racing heart and shortness of breath.  You have thoughts of hurting yourself or others. If you ever feel like you may hurt yourself or others, or have thoughts about taking your own life, get help right away. You can go to your nearest emergency department or call:  Your local emergency  services (911 in the [place]  A suicide crisis helpline, such as the [facility] at 425-587-1358. This is open 24-hours a day.  Summary  Taking steps to deal with stress can help calm you.  Medicines cannot cure anxiety disorders, but they can help ease symptoms.  Family, friends, and partners can play a big part in helping you recover from an anxiety disorder. This information is not intended to replace advice given to you by your health care provider. Make sure you discuss any questions you have with your health care provider. Document Released: 12/09/2016 Document Revised: 12/09/2016 Document Reviewed: 12/09/2016 Elsevier Interactive Patient Education  2018 [employer].  Sertraline tablets What is this medicine? SERTRALINE (SER tra leen) is used to treat depression. It may also be used to treat obsessive compulsive disorder, panic disorder, post-trauma stress, premenstrual dysphoric disorder (PMDD) or social anxiety. This medicine may be used for other purposes; ask your health care provider or pharmacist if you have questions. COMMON BRAND NAME(S): Zoloft What should I tell my health care provider before I take this medicine? They need to know if you have any of these conditions: -bleeding disorders -bipolar disorder or a family history of bipolar disorder -glaucoma -heart disease -high blood pressure -history of irregular heartbeat -history  of low levels of calcium, magnesium, or potassium in the blood -if you often drink alcohol -liver disease -receiving electroconvulsive therapy -seizures -suicidal thoughts, plans, or attempt; a previous suicide attempt by you or a family member -take medicines that treat or prevent blood clots -thyroid disease -an unusual or allergic reaction to sertraline, other medicines, foods, dyes, or preservatives -pregnant or trying to get pregnant -breast-feeding How should I use this medicine? Take this medicine by  mouth with a glass of water. Follow the directions on the prescription label. You can take it with or without food. Take your medicine at regular intervals. Do not take your medicine more often than directed. Do not stop taking this medicine suddenly except upon the advice of your doctor. Stopping this medicine too quickly may cause serious side effects or your condition may worsen. A special MedGuide will be given to you by the pharmacist with each prescription and refill. Be sure to read this information carefully each time. Talk to your pediatrician regarding the use of this medicine in children. While this drug may be prescribed for children as young as 7 years for selected conditions, precautions do apply. Overdosage: If you think you have taken too much of this medicine contact a poison control center or emergency room at once. NOTE: This medicine is only for you. Do not share this medicine with others. What if I miss a dose? If you miss a dose, take it as soon as you can. If it is almost time for your next dose, take only that dose. Do not take double or extra doses. What may interact with this medicine? Do not take this medicine with any of the following medications: -cisapride -dofetilide -dronedarone -linezolid -MAOIs like Carbex, Eldepryl, Marplan, Nardil, and Parnate -methylene blue (injected into a vein) -pimozide -thioridazine This medicine may also interact with the following medications: -alcohol -amphetamines -aspirin and aspirin-like medicines -certain medicines for depression, anxiety, or psychotic disturbances -certain medicines for fungal infections like ketoconazole, fluconazole, posaconazole, and itraconazole -certain medicines for irregular heart beat like flecainide, quinidine, propafenone -certain medicines for migraine headaches like almotriptan, eletriptan, frovatriptan, naratriptan, rizatriptan, sumatriptan, zolmitriptan -certain medicines for sleep -certain  medicines for seizures like carbamazepine, valproic acid, phenytoin -certain medicines that treat or prevent blood clots like warfarin, enoxaparin, dalteparin -cimetidine -digoxin -diuretics -fentanyl -isoniazid -lithium -NSAIDs, medicines for pain and inflammation, like ibuprofen or naproxen -other medicines that prolong the QT interval (cause an abnormal heart rhythm) -rasagiline -safinamide -supplements like St. John's wort, kava kava, valerian -tolbutamide -tramadol -tryptophan This list may not describe all possible interactions. Give your health care provider a list of all the medicines, herbs, non-prescription drugs, or dietary supplements you use. Also tell them if you smoke, drink alcohol, or use illegal drugs. Some items may interact with your medicine. What should I watch for while using this medicine? Tell your doctor if your symptoms do not get better or if they get worse. Visit your doctor or health care professional for regular checks on your progress. Because it may take several weeks to see the full effects of this medicine, it is important to continue your treatment as prescribed by your doctor. Patients and their families should watch out for new or worsening thoughts of suicide or depression. Also watch out for sudden changes in feelings such as feeling anxious, agitated, panicky, irritable, hostile, aggressive, impulsive, severely restless, overly excited and hyperactive, or not being able to sleep. If this happens, especially at the beginning of treatment or after  a change in dose, call your health care professional. Dennis Bast may get drowsy or dizzy. Do not drive, use machinery, or do anything that needs mental alertness until you know how this medicine affects you. Do not stand or sit up quickly, especially if you are an older patient. This reduces the risk of dizzy or fainting spells. Alcohol may interfere with the effect of this medicine. Avoid alcoholic drinks. Your mouth  may get dry. Chewing sugarless gum or sucking hard candy, and drinking plenty of water may help. Contact your doctor if the problem does not go away or is severe. What side effects may I notice from receiving this medicine? Side effects that you should report to your doctor or health care professional as soon as possible: -allergic reactions like skin rash, itching or hives, swelling of the face, lips, or tongue -anxious -black, tarry stools -changes in vision -confusion -elevated mood, decreased need for sleep, racing thoughts, impulsive behavior -eye pain -fast, irregular heartbeat -feeling faint or lightheaded, falls -feeling agitated, angry, or irritable -hallucination, loss of contact with reality -loss of balance or coordination -loss of memory -painful or prolonged erections -restlessness, pacing, inability to keep still -seizures -stiff muscles -suicidal thoughts or other mood changes -trouble sleeping -unusual bleeding or bruising -unusually weak or tired -vomiting Side effects that usually do not require medical attention (report to your doctor or health care professional if they continue or are bothersome): -change in appetite or weight -change in sex drive or performance -diarrhea -increased sweating -indigestion, nausea -tremors This list may not describe all possible side effects. Call your doctor for medical advice about side effects. You may report side effects to FDA at 1-800-FDA-1088. Where should I keep my medicine? Keep out of the reach of children. Store at room temperature between 15 and 30 degrees C (59 and 86 degrees F). Throw away any unused medicine after the expiration date. NOTE: This sheet is a summary. It may not cover all possible information. If you have questions about this medicine, talk to your doctor, pharmacist, or health care provider.  2018 Elsevier/Gold Standard (2016-12-19 14:17:49)

## 2018-12-02 DIAGNOSIS — J301 Allergic rhinitis due to pollen: Secondary | ICD-10-CM | POA: Diagnosis not present

## 2018-12-09 DIAGNOSIS — J301 Allergic rhinitis due to pollen: Secondary | ICD-10-CM | POA: Diagnosis not present

## 2018-12-16 DIAGNOSIS — J301 Allergic rhinitis due to pollen: Secondary | ICD-10-CM | POA: Diagnosis not present

## 2018-12-23 DIAGNOSIS — H1033 Unspecified acute conjunctivitis, bilateral: Secondary | ICD-10-CM | POA: Diagnosis not present

## 2018-12-23 DIAGNOSIS — J301 Allergic rhinitis due to pollen: Secondary | ICD-10-CM | POA: Diagnosis not present

## 2018-12-23 DIAGNOSIS — H1045 Other chronic allergic conjunctivitis: Secondary | ICD-10-CM | POA: Diagnosis not present

## 2018-12-24 DIAGNOSIS — J301 Allergic rhinitis due to pollen: Secondary | ICD-10-CM | POA: Diagnosis not present

## 2018-12-30 DIAGNOSIS — J301 Allergic rhinitis due to pollen: Secondary | ICD-10-CM | POA: Diagnosis not present

## 2019-01-06 DIAGNOSIS — J301 Allergic rhinitis due to pollen: Secondary | ICD-10-CM | POA: Diagnosis not present

## 2019-01-11 DIAGNOSIS — R682 Dry mouth, unspecified: Secondary | ICD-10-CM | POA: Diagnosis not present

## 2019-01-11 DIAGNOSIS — J301 Allergic rhinitis due to pollen: Secondary | ICD-10-CM | POA: Diagnosis not present

## 2019-01-11 DIAGNOSIS — H6122 Impacted cerumen, left ear: Secondary | ICD-10-CM | POA: Diagnosis not present

## 2019-01-13 DIAGNOSIS — J301 Allergic rhinitis due to pollen: Secondary | ICD-10-CM | POA: Diagnosis not present

## 2019-01-20 DIAGNOSIS — J301 Allergic rhinitis due to pollen: Secondary | ICD-10-CM | POA: Diagnosis not present

## 2019-02-03 DIAGNOSIS — J301 Allergic rhinitis due to pollen: Secondary | ICD-10-CM | POA: Diagnosis not present

## 2019-02-14 ENCOUNTER — Encounter: Payer: Self-pay | Admitting: Obstetrics & Gynecology

## 2019-02-14 ENCOUNTER — Ambulatory Visit (INDEPENDENT_AMBULATORY_CARE_PROVIDER_SITE_OTHER): Payer: PPO | Admitting: Obstetrics & Gynecology

## 2019-02-14 VITALS — BP 120/70 | Ht 67.0 in | Wt 150.0 lb

## 2019-02-14 DIAGNOSIS — N952 Postmenopausal atrophic vaginitis: Secondary | ICD-10-CM

## 2019-02-14 DIAGNOSIS — Z01419 Encounter for gynecological examination (general) (routine) without abnormal findings: Secondary | ICD-10-CM

## 2019-02-14 DIAGNOSIS — J301 Allergic rhinitis due to pollen: Secondary | ICD-10-CM | POA: Diagnosis not present

## 2019-02-14 MED ORDER — ESTRADIOL 0.1 MG/GM VA CREA: 1.0000 g | TOPICAL_CREAM | VAGINAL | 12 refills | Status: DC

## 2019-02-14 NOTE — Patient Instructions (Signed)
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
## 2019-02-21 DIAGNOSIS — J301 Allergic rhinitis due to pollen: Secondary | ICD-10-CM | POA: Diagnosis not present

## 2019-02-28 DIAGNOSIS — J301 Allergic rhinitis due to pollen: Secondary | ICD-10-CM | POA: Diagnosis not present

## 2019-03-01 ENCOUNTER — Ambulatory Visit (INDEPENDENT_AMBULATORY_CARE_PROVIDER_SITE_OTHER): Payer: PPO | Admitting: Nurse Practitioner

## 2019-03-01 ENCOUNTER — Encounter: Payer: Self-pay | Admitting: Nurse Practitioner

## 2019-03-01 VITALS — BP 137/77 | HR 64 | Temp 98.2°F | Ht 67.0 in | Wt 153.0 lb

## 2019-03-01 DIAGNOSIS — F4312 Post-traumatic stress disorder, chronic: Secondary | ICD-10-CM | POA: Diagnosis not present

## 2019-03-01 DIAGNOSIS — E781 Pure hyperglyceridemia: Secondary | ICD-10-CM

## 2019-03-01 DIAGNOSIS — I10 Essential (primary) hypertension: Secondary | ICD-10-CM

## 2019-03-01 NOTE — Patient Instructions (Signed)
Living With Anxiety    After being diagnosed with an anxiety disorder, you may be relieved to know why you have felt or behaved a certain way. It is natural to also feel overwhelmed about the treatment ahead and what it will mean for your life. With care and support, you can manage this condition and recover from it.  How to cope with anxiety  Dealing with stress  Stress is your body’s reaction to life changes and events, both good and bad. Stress can last just a few hours or it can be ongoing. Stress can play a major role in anxiety, so it is important to learn both how to cope with stress and how to think about it differently.  Talk with your health care provider or a counselor to learn more about stress reduction. He or she may suggest some stress reduction techniques, such as:  · Music therapy. This can include creating or listening to music that you enjoy and that inspires you.  · Mindfulness-based meditation. This involves being aware of your normal breaths, rather than trying to control your breathing. It can be done while sitting or walking.  · Centering prayer. This is a kind of meditation that involves focusing on a word, phrase, or sacred image that is meaningful to you and that brings you peace.  · Deep breathing. To do this, expand your stomach and inhale slowly through your nose. Hold your breath for 3-5 seconds. Then exhale slowly, allowing your stomach muscles to relax.  · Self-talk. This is a skill where you identify thought patterns that lead to anxiety reactions and correct those thoughts.  · Muscle relaxation. This involves tensing muscles then relaxing them.  Choose a stress reduction technique that fits your lifestyle and personality. Stress reduction techniques take time and practice. Set aside 5-15 minutes a day to do them. Therapists can offer training in these techniques. The training may be covered by some insurance plans. Other things you can do to manage stress include:  · Keeping a  stress diary. This can help you learn what triggers your stress and ways to control your response.  · Thinking about how you respond to certain situations. You may not be able to control everything, but you can control your reaction.  · Making time for activities that help you relax, and not feeling guilty about spending your time in this way.  Therapy combined with coping and stress-reduction skills provides the best chance for successful treatment.  Medicines  Medicines can help ease symptoms. Medicines for anxiety include:  · Anti-anxiety drugs.  · Antidepressants.  · Beta-blockers.  Medicines may be used as the main treatment for anxiety disorder, along with therapy, or if other treatments are not working. Medicines should be prescribed by a health care provider.  Relationships  Relationships can play a big part in helping you recover. Try to spend more time connecting with trusted friends and family members. Consider going to couples counseling, taking family education classes, or going to family therapy. Therapy can help you and others better understand the condition.  How to recognize changes in your condition  Everyone has a different response to treatment for anxiety. Recovery from anxiety happens when symptoms decrease and stop interfering with your daily activities at home or work. This may mean that you will start to:  · Have better concentration and focus.  · Sleep better.  · Be less irritable.  · Have more energy.  · Have improved memory.  It is   important to recognize when your condition is getting worse. Contact your health care provider if your symptoms interfere with home or work and you do not feel like your condition is improving.  Where to find help and support:  You can get help and support from these sources:  · Self-help groups.  · Online and community organizations.  · A trusted spiritual leader.  · Couples counseling.  · Family education classes.  · Family therapy.  Follow these instructions  at home:  · Eat a healthy diet that includes plenty of vegetables, fruits, whole grains, low-fat dairy products, and lean protein. Do not eat a lot of foods that are high in solid fats, added sugars, or salt.  · Exercise. Most adults should do the following:  ? Exercise for at least 150 minutes each week. The exercise should increase your heart rate and make you sweat (moderate-intensity exercise).  ? Strengthening exercises at least twice a week.  · Cut down on caffeine, tobacco, alcohol, and other potentially harmful substances.  · Get the right amount and quality of sleep. Most adults need 7-9 hours of sleep each night.  · Make choices that simplify your life.  · Take over-the-counter and prescription medicines only as told by your health care provider.  · Avoid caffeine, alcohol, and certain over-the-counter cold medicines. These may make you feel worse. Ask your pharmacist which medicines to avoid.  · Keep all follow-up visits as told by your health care provider. This is important.  Questions to ask your health care provider  · Would I benefit from therapy?  · How often should I follow up with a health care provider?  · How long do I need to take medicine?  · Are there any long-term side effects of my medicine?  · Are there any alternatives to taking medicine?  Contact a health care provider if:  · You have a hard time staying focused or finishing daily tasks.  · You spend many hours a day feeling worried about everyday life.  · You become exhausted by worry.  · You start to have headaches, feel tense, or have nausea.  · You urinate more than normal.  · You have diarrhea.  Get help right away if:  · You have a racing heart and shortness of breath.  · You have thoughts of hurting yourself or others.  If you ever feel like you may hurt yourself or others, or have thoughts about taking your own life, get help right away. You can go to your nearest emergency department or call:  · Your local emergency services  (911 in the [place]  · A suicide crisis helpline, such as the National Suicide Prevention Lifeline at 1-800-273-8255. This is open 24-hours a day.  Summary  · Taking steps to deal with stress can help calm you.  · Medicines cannot cure anxiety disorders, but they can help ease symptoms.  · Family, friends, and partners can play a big part in helping you recover from an anxiety disorder.  This information is not intended to replace advice given to you by your health care provider. Make sure you discuss any questions you have with your health care provider.  Document Released: 12/09/2016 Document Revised: 12/09/2016 Document Reviewed: 12/09/2016  Elsevier Interactive Patient Education © 2019 [employer] Inc[employer]

## 2019-03-01 NOTE — Assessment & Plan Note (Signed)
Stable, WNL. Continue current diet and lifestyle control.
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
## 2019-03-01 NOTE — Assessment & Plan Note (Signed)
Continue current lifestyle modifications and plan to measure in July per patient request.

## 2019-03-01 NOTE — Assessment & Plan Note (Signed)
Referral to psychology for CBT. Pt changing to provider closer to home. Will remain available for follow-up as needed until care is established.

## 2019-03-07 DIAGNOSIS — J301 Allergic rhinitis due to pollen: Secondary | ICD-10-CM | POA: Diagnosis not present

## 2019-03-08 DIAGNOSIS — H6123 Impacted cerumen, bilateral: Secondary | ICD-10-CM | POA: Diagnosis not present

## 2019-03-08 DIAGNOSIS — H7202 Central perforation of tympanic membrane, left ear: Secondary | ICD-10-CM | POA: Diagnosis not present

## 2019-03-14 DIAGNOSIS — J301 Allergic rhinitis due to pollen: Secondary | ICD-10-CM | POA: Diagnosis not present

## 2019-04-12 ENCOUNTER — Other Ambulatory Visit: Payer: Self-pay

## 2019-04-12 ENCOUNTER — Ambulatory Visit (INDEPENDENT_AMBULATORY_CARE_PROVIDER_SITE_OTHER): Payer: PPO | Admitting: Psychiatry

## 2019-04-12 ENCOUNTER — Encounter (HOSPITAL_COMMUNITY): Payer: Self-pay | Admitting: Psychiatry

## 2019-04-12 DIAGNOSIS — F4312 Post-traumatic stress disorder, chronic: Secondary | ICD-10-CM
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
## 2019-04-18 DIAGNOSIS — J301 Allergic rhinitis due to pollen: Secondary | ICD-10-CM | POA: Diagnosis not present

## 2019-04-19 DIAGNOSIS — J309 Allergic rhinitis, unspecified: Secondary | ICD-10-CM | POA: Diagnosis not present

## 2019-04-19 DIAGNOSIS — N951 Menopausal and female climacteric states: Secondary | ICD-10-CM | POA: Diagnosis not present

## 2019-04-19 DIAGNOSIS — M810 Age-related osteoporosis without current pathological fracture: Secondary | ICD-10-CM | POA: Diagnosis not present

## 2019-04-19 DIAGNOSIS — I10 Essential (primary) hypertension: Secondary | ICD-10-CM | POA: Diagnosis not present

## 2019-04-19 DIAGNOSIS — E785 Hyperlipidemia, unspecified: Secondary | ICD-10-CM | POA: Diagnosis not present

## 2019-04-19 DIAGNOSIS — F419 Anxiety disorder, unspecified: Secondary | ICD-10-CM | POA: Diagnosis not present

## 2019-04-19 DIAGNOSIS — M542 Cervicalgia: Secondary | ICD-10-CM | POA: Diagnosis not present

## 2019-04-21 DIAGNOSIS — J301 Allergic rhinitis due to pollen: Secondary | ICD-10-CM | POA: Diagnosis not present

## 2019-04-22 ENCOUNTER — Ambulatory Visit (HOSPITAL_COMMUNITY): Payer: PPO | Admitting: Psychiatry

## 2019-04-28 DIAGNOSIS — J301 Allergic rhinitis due to pollen: Secondary | ICD-10-CM | POA: Diagnosis not present

## 2019-05-03 ENCOUNTER — Ambulatory Visit (INDEPENDENT_AMBULATORY_CARE_PROVIDER_SITE_OTHER): Payer: PPO | Admitting: Psychiatry

## 2019-05-03 ENCOUNTER — Encounter (HOSPITAL_COMMUNITY): Payer: Self-pay | Admitting: Psychiatry

## 2019-05-03 ENCOUNTER — Other Ambulatory Visit: Payer: Self-pay

## 2019-05-03 DIAGNOSIS — F4312 Post-traumatic stress disorder, chronic: Secondary | ICD-10-CM | POA: Diagnosis not present
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
## 2019-05-05 DIAGNOSIS — J301 Allergic rhinitis due to pollen: Secondary | ICD-10-CM | POA: Diagnosis not present

## 2019-05-10 DIAGNOSIS — H6123 Impacted cerumen, bilateral: Secondary | ICD-10-CM | POA: Diagnosis not present

## 2019-05-10 DIAGNOSIS — J301 Allergic rhinitis due to pollen: Secondary | ICD-10-CM | POA: Diagnosis not present

## 2019-05-10 DIAGNOSIS — H7202 Central perforation of tympanic membrane, left ear: Secondary | ICD-10-CM | POA: Diagnosis not present

## 2019-05-12 DIAGNOSIS — J301 Allergic rhinitis due to pollen: Secondary | ICD-10-CM | POA: Diagnosis not present

## 2019-05-19 DIAGNOSIS — J301 Allergic rhinitis due to pollen: Secondary | ICD-10-CM | POA: Diagnosis not present

## 2019-05-26 DIAGNOSIS — J301 Allergic rhinitis due to pollen: Secondary | ICD-10-CM | POA: Diagnosis not present

## 2019-05-31 ENCOUNTER — Ambulatory Visit (HOSPITAL_COMMUNITY): Payer: PPO | Admitting: Psychiatry

## 2019-06-02 DIAGNOSIS — J301 Allergic rhinitis due to pollen: Secondary | ICD-10-CM | POA: Diagnosis not present

## 2019-06-07 ENCOUNTER — Other Ambulatory Visit: Payer: Self-pay

## 2019-06-07 ENCOUNTER — Ambulatory Visit (INDEPENDENT_AMBULATORY_CARE_PROVIDER_SITE_OTHER): Payer: PPO | Admitting: Psychiatry

## 2019-06-07 DIAGNOSIS — F4312 Post-traumatic stress disorder, chronic: Secondary | ICD-10-CM

## 2019-06-08 ENCOUNTER — Encounter (HOSPITAL_COMMUNITY): Payer: Self-pay | Admitting: Psychiatry
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
## 2019-06-09 DIAGNOSIS — J301 Allergic rhinitis due to pollen: Secondary | ICD-10-CM | POA: Diagnosis not present

## 2019-06-16 DIAGNOSIS — J301 Allergic rhinitis due to pollen: Secondary | ICD-10-CM | POA: Diagnosis not present

## 2019-06-23 DIAGNOSIS — J301 Allergic rhinitis due to pollen: Secondary | ICD-10-CM | POA: Diagnosis not present

## 2019-06-30 DIAGNOSIS — J301 Allergic rhinitis due to pollen: Secondary | ICD-10-CM | POA: Diagnosis not present

## 2019-07-07 DIAGNOSIS — J301 Allergic rhinitis due to pollen: Secondary | ICD-10-CM | POA: Diagnosis not present

## 2019-07-08 DIAGNOSIS — J301 Allergic rhinitis due to pollen: Secondary | ICD-10-CM | POA: Diagnosis not present

## 2019-07-14 DIAGNOSIS — J301 Allergic rhinitis due to pollen: Secondary | ICD-10-CM | POA: Diagnosis not present

## 2019-07-19 DIAGNOSIS — H6123 Impacted cerumen, bilateral: Secondary | ICD-10-CM | POA: Diagnosis not present

## 2019-07-19 DIAGNOSIS — J301 Allergic rhinitis due to pollen: Secondary | ICD-10-CM | POA: Diagnosis not present

## 2019-07-20 ENCOUNTER — Ambulatory Visit (HOSPITAL_COMMUNITY): Payer: PPO | Admitting: Psychiatry

## 2019-07-21 DIAGNOSIS — J301 Allergic rhinitis due to pollen: Secondary | ICD-10-CM | POA: Diagnosis not present

## 2019-07-28 DIAGNOSIS — J301 Allergic rhinitis due to pollen: Secondary | ICD-10-CM | POA: Diagnosis not present

## 2019-07-29 ENCOUNTER — Telehealth: Payer: Self-pay

## 2019-07-29 NOTE — Telephone Encounter (Signed)
Pt reports her recent rx of Estradiol sent to [facility] was 12.5g/$35. She usually gets 30gm/10 wk supply $40. She doesn't want to pay $30/mo. She would like to get the 30g supply. KF#[phone].
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
## 2019-07-29 NOTE — Telephone Encounter (Signed)
LMVM to notify pt of Pharmacy error & they would be contacting her to correct it.

## 2019-08-04 DIAGNOSIS — J301 Allergic rhinitis due to pollen: Secondary | ICD-10-CM | POA: Diagnosis not present

## 2019-08-11 DIAGNOSIS — J301 Allergic rhinitis due to pollen: Secondary | ICD-10-CM | POA: Diagnosis not present

## 2019-08-16 ENCOUNTER — Encounter (HOSPITAL_COMMUNITY): Payer: Self-pay | Admitting: Psychiatry

## 2019-08-16 ENCOUNTER — Ambulatory Visit (INDEPENDENT_AMBULATORY_CARE_PROVIDER_SITE_OTHER): Payer: PPO | Admitting: Psychiatry

## 2019-08-16 DIAGNOSIS — F4312 Post-traumatic stress disorder, chronic: Secondary | ICD-10-CM | POA: Diagnosis not present
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
## 2019-08-25 DIAGNOSIS — J301 Allergic rhinitis due to pollen: Secondary | ICD-10-CM | POA: Diagnosis not present

## 2019-09-01 DIAGNOSIS — J301 Allergic rhinitis due to pollen: Secondary | ICD-10-CM | POA: Diagnosis not present

## 2019-09-13 DIAGNOSIS — M25519 Pain in unspecified shoulder: Secondary | ICD-10-CM | POA: Diagnosis not present

## 2019-09-13 DIAGNOSIS — Z0000 Encounter for general adult medical examination without abnormal findings: Secondary | ICD-10-CM | POA: Diagnosis not present

## 2019-09-13 DIAGNOSIS — F419 Anxiety disorder, unspecified: Secondary | ICD-10-CM | POA: Diagnosis not present

## 2019-09-13 DIAGNOSIS — M810 Age-related osteoporosis without current pathological fracture: Secondary | ICD-10-CM | POA: Diagnosis not present

## 2019-09-13 DIAGNOSIS — J309 Allergic rhinitis, unspecified: Secondary | ICD-10-CM | POA: Diagnosis not present

## 2019-09-13 DIAGNOSIS — E785 Hyperlipidemia, unspecified: Secondary | ICD-10-CM | POA: Diagnosis not present

## 2019-09-13 DIAGNOSIS — N951 Menopausal and female climacteric states: Secondary | ICD-10-CM | POA: Diagnosis not present

## 2019-09-13 DIAGNOSIS — I10 Essential (primary) hypertension: Secondary | ICD-10-CM | POA: Diagnosis not present

## 2019-09-13 DIAGNOSIS — Z1389 Encounter for screening for other disorder: Secondary | ICD-10-CM | POA: Diagnosis not present

## 2019-09-15 ENCOUNTER — Other Ambulatory Visit: Payer: Self-pay | Admitting: Family Medicine

## 2019-09-15 DIAGNOSIS — Z1231 Encounter for screening mammogram for malignant neoplasm of breast: Secondary | ICD-10-CM

## 2019-09-15 DIAGNOSIS — J301 Allergic rhinitis due to pollen: Secondary | ICD-10-CM | POA: Diagnosis not present

## 2019-09-22 DIAGNOSIS — E785 Hyperlipidemia, unspecified: Secondary | ICD-10-CM | POA: Diagnosis not present

## 2019-09-22 DIAGNOSIS — M810 Age-related osteoporosis without current pathological fracture: Secondary | ICD-10-CM | POA: Diagnosis not present

## 2019-09-22 DIAGNOSIS — I10 Essential (primary) hypertension: Secondary | ICD-10-CM | POA: Diagnosis not present

## 2019-09-22 DIAGNOSIS — Z23 Encounter for immunization: Secondary | ICD-10-CM | POA: Diagnosis not present

## 2019-09-29 DIAGNOSIS — J301 Allergic rhinitis due to pollen: Secondary | ICD-10-CM | POA: Diagnosis not present

## 2019-09-30 DIAGNOSIS — J301 Allergic rhinitis due to pollen: Secondary | ICD-10-CM | POA: Diagnosis not present

## 2019-10-10 ENCOUNTER — Ambulatory Visit (HOSPITAL_COMMUNITY): Payer: PPO | Admitting: Psychiatry

## 2019-10-11 DIAGNOSIS — H6123 Impacted cerumen, bilateral: Secondary | ICD-10-CM | POA: Diagnosis not present

## 2019-10-11 DIAGNOSIS — J301 Allergic rhinitis due to pollen: Secondary | ICD-10-CM | POA: Diagnosis not present

## 2019-10-13 ENCOUNTER — Ambulatory Visit (INDEPENDENT_AMBULATORY_CARE_PROVIDER_SITE_OTHER): Payer: PPO | Admitting: Psychiatry

## 2019-10-13 ENCOUNTER — Other Ambulatory Visit: Payer: Self-pay

## 2019-10-13 DIAGNOSIS — F4312 Post-traumatic stress disorder, chronic: Secondary | ICD-10-CM

## 2019-10-14 ENCOUNTER — Encounter (HOSPITAL_COMMUNITY): Payer: Self-pay | Admitting: Psychiatry
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
## 2019-10-20 DIAGNOSIS — J301 Allergic rhinitis due to pollen: Secondary | ICD-10-CM | POA: Diagnosis not present

## 2019-10-25 ENCOUNTER — Ambulatory Visit
Admission: RE | Admit: 2019-10-25 | Discharge: 2019-10-25 | Disposition: A | Discharge status: Home / Self Care | Payer: PPO | Source: Ambulatory Visit | Attending: Family Medicine | Admitting: Family Medicine

## 2019-10-25 DIAGNOSIS — Z1231 Encounter for screening mammogram for malignant neoplasm of breast: Secondary | ICD-10-CM | POA: Diagnosis not present

## 2019-10-27 DIAGNOSIS — J301 Allergic rhinitis due to pollen: Secondary | ICD-10-CM | POA: Diagnosis not present

## 2019-11-03 DIAGNOSIS — J301 Allergic rhinitis due to pollen: Secondary | ICD-10-CM | POA: Diagnosis not present

## 2019-11-17 DIAGNOSIS — J301 Allergic rhinitis due to pollen: Secondary | ICD-10-CM | POA: Diagnosis not present

## 2019-12-01 ENCOUNTER — Ambulatory Visit (HOSPITAL_COMMUNITY): Payer: PPO | Admitting: Psychiatry

## 2019-12-01 DIAGNOSIS — J301 Allergic rhinitis due to pollen: Secondary | ICD-10-CM | POA: Diagnosis not present

## 2019-12-13 DIAGNOSIS — J301 Allergic rhinitis due to pollen: Secondary | ICD-10-CM | POA: Diagnosis not present

## 2019-12-13 DIAGNOSIS — H7202 Central perforation of tympanic membrane, left ear: Secondary | ICD-10-CM | POA: Diagnosis not present

## 2019-12-13 DIAGNOSIS — H6122 Impacted cerumen, left ear: Secondary | ICD-10-CM | POA: Diagnosis not present

## 2019-12-15 DIAGNOSIS — J301 Allergic rhinitis due to pollen: Secondary | ICD-10-CM | POA: Diagnosis not present

## 2019-12-21 DIAGNOSIS — J301 Allergic rhinitis due to pollen: Secondary | ICD-10-CM | POA: Diagnosis not present

## 2020-01-03 ENCOUNTER — Ambulatory Visit (INDEPENDENT_AMBULATORY_CARE_PROVIDER_SITE_OTHER): Payer: PPO | Admitting: Psychiatry

## 2020-01-03 ENCOUNTER — Other Ambulatory Visit: Payer: Self-pay

## 2020-01-03 ENCOUNTER — Encounter (HOSPITAL_COMMUNITY): Payer: Self-pay | Admitting: Psychiatry

## 2020-01-03 DIAGNOSIS — F4312 Post-traumatic stress disorder, chronic: Secondary | ICD-10-CM | POA: Diagnosis not present

## 2020-01-03 NOTE — Progress Notes (Signed)
Virtual Visit via Telephone Note  I connected with Hailey Wall on 01/03/20 at  4:00 PM EST by telephone and verified that I am speaking with the correct person using two identifiers.  Location: Patient: Hailey Wall Provider: Lise Auer, LCSW   I discussed the limitations, risks, security and privacy concerns of performing an evaluation and management service by telephone and the availability of in person appointments. I also discussed with the patient that there may be a patient responsible charge related to this service. The patient expressed understanding and agreed to proceed.  History of Present Illness: Chronic PTSD   Observations/Objective: Counselor met with Hailey Wall for individual therapy via Webex. Counselor assessed MH symptoms and progress on treatment plan goals. Hailey Wall presents with moderate anxiety and mild depression. Hailey Wall denied suicidal ideation or self-harm behaviors. Hailey Wall shared that she has faced several challenging circumstances with her adult children since our last session. Counselor processed thoughts and feelings about the changes in family dynamics and how she has been impacted emotionally and mentally. Counselor praised Hailey Wall for application of skills discussed in previous session. Counselor and Hailey Wall discussed trauma triggers and how to better cope to reduce negative impacts on self and in relationships. Hailey Wall shared the benefits of treatment and upcoming needs. Counselor reviewed self-care strategies and encouraged implementation.   Assessment and Plan: Counselor will continue to meet with patient to address treatment plan goals. Patient will continue to follow recommendations of providers and implement skills learned in session.  Follow Up Instructions: Counselor will send information for next session via Webex.     I discussed the assessment and treatment plan with the patient. The patient was provided an opportunity to ask questions and all were answered.  The patient agreed with the plan and demonstrated an understanding of the instructions.   The patient was advised to call back or seek an in-person evaluation if the symptoms worsen or if the condition fails to improve as anticipated.  I provided 55 minutes of non-face-to-face time during this encounter.   Lise Auer, LCSW

## 2020-01-03 NOTE — Addendum Note (Signed)
Addended by: Lise Auer on: 01/03/2020 10:27 PM   Modules accepted: Level of Service

## 2020-01-05 DIAGNOSIS — J301 Allergic rhinitis due to pollen: Secondary | ICD-10-CM | POA: Diagnosis not present

## 2020-01-10 ENCOUNTER — Ambulatory Visit (HOSPITAL_COMMUNITY): Payer: PPO | Admitting: Psychiatry

## 2020-01-12 DIAGNOSIS — J301 Allergic rhinitis due to pollen: Secondary | ICD-10-CM | POA: Diagnosis not present

## 2020-01-19 DIAGNOSIS — J301 Allergic rhinitis due to pollen: Secondary | ICD-10-CM | POA: Diagnosis not present

## 2020-01-26 DIAGNOSIS — J301 Allergic rhinitis due to pollen: Secondary | ICD-10-CM | POA: Diagnosis not present

## 2020-02-02 DIAGNOSIS — J301 Allergic rhinitis due to pollen: Secondary | ICD-10-CM | POA: Diagnosis not present

## 2020-02-07 ENCOUNTER — Other Ambulatory Visit: Payer: Self-pay

## 2020-02-07 ENCOUNTER — Ambulatory Visit (HOSPITAL_COMMUNITY): Payer: PPO | Admitting: Psychiatry

## 2020-02-09 DIAGNOSIS — J301 Allergic rhinitis due to pollen: Secondary | ICD-10-CM | POA: Diagnosis not present

## 2020-02-20 ENCOUNTER — Ambulatory Visit (INDEPENDENT_AMBULATORY_CARE_PROVIDER_SITE_OTHER): Payer: PPO | Admitting: Obstetrics & Gynecology

## 2020-02-20 ENCOUNTER — Telehealth: Payer: Self-pay | Admitting: Obstetrics & Gynecology

## 2020-02-20 ENCOUNTER — Encounter: Payer: Self-pay | Admitting: Obstetrics & Gynecology

## 2020-02-20 ENCOUNTER — Other Ambulatory Visit: Payer: Self-pay

## 2020-02-20 ENCOUNTER — Telehealth: Payer: Self-pay

## 2020-02-20 VITALS — BP 120/80 | Ht 66.0 in | Wt 152.0 lb

## 2020-02-20 DIAGNOSIS — N952 Postmenopausal atrophic vaginitis: Secondary | ICD-10-CM | POA: Diagnosis not present

## 2020-02-20 DIAGNOSIS — N8111 Cystocele, midline: Secondary | ICD-10-CM | POA: Insufficient documentation

## 2020-02-20 DIAGNOSIS — Z90710 Acquired absence of both cervix and uterus: Secondary | ICD-10-CM

## 2020-02-20 MED ORDER — ESTRADIOL 0.1 MG/GM VA CREA: 1.0000 g | TOPICAL_CREAM | VAGINAL | 12 refills | Status: DC
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
## 2020-02-20 NOTE — Telephone Encounter (Signed)
Pharmacy called states pt was on Estriol 0.1% cream do we want to keep the same ?

## 2020-02-20 NOTE — Addendum Note (Signed)
Addended by: Quintella Baton D on: 02/20/2020 10:39 AM   Modules accepted: Orders

## 2020-02-20 NOTE — Telephone Encounter (Signed)
Did you send in the plant based rx? Same as the Estriol 10% cream? Pt states she almost died when she used estradiol.please advise

## 2020-02-20 NOTE — Telephone Encounter (Signed)
Sent to both [facility], but realize its the [facility] to [facility] meds.  Rx there now.

## 2020-02-20 NOTE — Telephone Encounter (Signed)
Patient is calling about her prescriptions for Estriol 10 % CREAM. Patient is reporting that estridol was sent to the pharmacy and she doesn't take this. [facility] would like [facility] [phone] as her pharmacy on file.

## 2020-02-23 DIAGNOSIS — J301 Allergic rhinitis due to pollen: Secondary | ICD-10-CM | POA: Diagnosis not present

## 2020-03-01 DIAGNOSIS — J301 Allergic rhinitis due to pollen: Secondary | ICD-10-CM | POA: Diagnosis not present

## 2020-03-06 DIAGNOSIS — H7203 Central perforation of tympanic membrane, bilateral: Secondary | ICD-10-CM | POA: Diagnosis not present

## 2020-03-06 DIAGNOSIS — H6123 Impacted cerumen, bilateral: Secondary | ICD-10-CM | POA: Diagnosis not present

## 2020-03-06 DIAGNOSIS — H9211 Otorrhea, right ear: Secondary | ICD-10-CM | POA: Diagnosis not present

## 2020-03-08 DIAGNOSIS — J301 Allergic rhinitis due to pollen: Secondary | ICD-10-CM | POA: Diagnosis not present

## 2020-03-15 DIAGNOSIS — J301 Allergic rhinitis due to pollen: Secondary | ICD-10-CM | POA: Diagnosis not present

## 2020-03-16 DIAGNOSIS — J301 Allergic rhinitis due to pollen: Secondary | ICD-10-CM | POA: Diagnosis not present

## 2020-03-22 DIAGNOSIS — J301 Allergic rhinitis due to pollen: Secondary | ICD-10-CM | POA: Diagnosis not present

## 2020-03-29 DIAGNOSIS — J301 Allergic rhinitis due to pollen: Secondary | ICD-10-CM | POA: Diagnosis not present

## 2020-04-03 DIAGNOSIS — H0100A Unspecified blepharitis right eye, upper and lower eyelids: Secondary | ICD-10-CM | POA: Diagnosis not present

## 2020-04-03 DIAGNOSIS — H04123 Dry eye syndrome of bilateral lacrimal glands: Secondary | ICD-10-CM | POA: Diagnosis not present

## 2020-04-03 DIAGNOSIS — H0100B Unspecified blepharitis left eye, upper and lower eyelids: Secondary | ICD-10-CM | POA: Diagnosis not present

## 2020-04-12 DIAGNOSIS — J301 Allergic rhinitis due to pollen: Secondary | ICD-10-CM | POA: Diagnosis not present

## 2020-04-19 DIAGNOSIS — J301 Allergic rhinitis due to pollen: Secondary | ICD-10-CM | POA: Diagnosis not present

## 2020-05-03 ENCOUNTER — Other Ambulatory Visit: Payer: Self-pay | Admitting: Obstetrics & Gynecology

## 2020-05-03 DIAGNOSIS — J301 Allergic rhinitis due to pollen: Secondary | ICD-10-CM | POA: Diagnosis not present

## 2020-05-10 DIAGNOSIS — J301 Allergic rhinitis due to pollen: Secondary | ICD-10-CM | POA: Diagnosis not present

## 2020-06-01 DIAGNOSIS — J301 Allergic rhinitis due to pollen: Secondary | ICD-10-CM | POA: Diagnosis not present

## 2020-06-05 DIAGNOSIS — H6122 Impacted cerumen, left ear: Secondary | ICD-10-CM | POA: Diagnosis not present

## 2020-06-05 DIAGNOSIS — H7202 Central perforation of tympanic membrane, left ear: Secondary | ICD-10-CM | POA: Diagnosis not present

## 2020-06-05 DIAGNOSIS — G253 Myoclonus: Secondary | ICD-10-CM | POA: Diagnosis not present

## 2020-06-05 DIAGNOSIS — J301 Allergic rhinitis due to pollen: Secondary | ICD-10-CM | POA: Diagnosis not present

## 2020-06-14 DIAGNOSIS — J301 Allergic rhinitis due to pollen: Secondary | ICD-10-CM | POA: Diagnosis not present

## 2020-06-21 DIAGNOSIS — J301 Allergic rhinitis due to pollen: Secondary | ICD-10-CM | POA: Diagnosis not present

## 2020-06-28 DIAGNOSIS — J301 Allergic rhinitis due to pollen: Secondary | ICD-10-CM | POA: Diagnosis not present

## 2020-07-12 DIAGNOSIS — J301 Allergic rhinitis due to pollen: Secondary | ICD-10-CM | POA: Diagnosis not present

## 2020-07-18 DIAGNOSIS — F4310 Post-traumatic stress disorder, unspecified: Secondary | ICD-10-CM | POA: Diagnosis not present

## 2020-07-24 DIAGNOSIS — F419 Anxiety disorder, unspecified: Secondary | ICD-10-CM | POA: Diagnosis not present

## 2020-07-24 DIAGNOSIS — Z8619 Personal history of other infectious and parasitic diseases: Secondary | ICD-10-CM | POA: Diagnosis not present

## 2020-07-24 DIAGNOSIS — I10 Essential (primary) hypertension: Secondary | ICD-10-CM | POA: Diagnosis not present

## 2020-07-24 DIAGNOSIS — J329 Chronic sinusitis, unspecified: Secondary | ICD-10-CM | POA: Diagnosis not present

## 2020-07-26 DIAGNOSIS — J301 Allergic rhinitis due to pollen: Secondary | ICD-10-CM | POA: Diagnosis not present

## 2020-07-31 ENCOUNTER — Ambulatory Visit (INDEPENDENT_AMBULATORY_CARE_PROVIDER_SITE_OTHER): Payer: PPO | Admitting: Psychiatry

## 2020-07-31 ENCOUNTER — Other Ambulatory Visit: Payer: Self-pay

## 2020-07-31 DIAGNOSIS — F411 Generalized anxiety disorder: Secondary | ICD-10-CM | POA: Diagnosis not present

## 2020-07-31 DIAGNOSIS — F4312 Post-traumatic stress disorder, chronic: Secondary | ICD-10-CM

## 2020-08-01 ENCOUNTER — Encounter (HOSPITAL_COMMUNITY): Payer: Self-pay | Admitting: Psychiatry
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
## 2020-08-02 DIAGNOSIS — J301 Allergic rhinitis due to pollen: Secondary | ICD-10-CM | POA: Diagnosis not present

## 2020-08-07 DIAGNOSIS — H6123 Impacted cerumen, bilateral: Secondary | ICD-10-CM | POA: Diagnosis not present

## 2020-08-07 DIAGNOSIS — H7202 Central perforation of tympanic membrane, left ear: Secondary | ICD-10-CM | POA: Diagnosis not present

## 2020-08-07 DIAGNOSIS — J019 Acute sinusitis, unspecified: Secondary | ICD-10-CM | POA: Diagnosis not present

## 2020-08-15 DIAGNOSIS — F419 Anxiety disorder, unspecified: Secondary | ICD-10-CM | POA: Diagnosis not present

## 2020-08-15 DIAGNOSIS — M810 Age-related osteoporosis without current pathological fracture: Secondary | ICD-10-CM | POA: Diagnosis not present

## 2020-08-15 DIAGNOSIS — F4310 Post-traumatic stress disorder, unspecified: Secondary | ICD-10-CM | POA: Diagnosis not present

## 2020-08-16 ENCOUNTER — Other Ambulatory Visit: Payer: Self-pay

## 2020-08-16 ENCOUNTER — Encounter: Payer: Self-pay | Admitting: Dermatology

## 2020-08-16 ENCOUNTER — Ambulatory Visit: Payer: PPO | Admitting: Dermatology

## 2020-08-16 DIAGNOSIS — L820 Inflamed seborrheic keratosis: Secondary | ICD-10-CM | POA: Diagnosis not present

## 2020-08-16 DIAGNOSIS — L821 Other seborrheic keratosis: Secondary | ICD-10-CM

## 2020-08-16 DIAGNOSIS — C4491 Basal cell carcinoma of skin, unspecified: Secondary | ICD-10-CM

## 2020-08-16 DIAGNOSIS — J301 Allergic rhinitis due to pollen: Secondary | ICD-10-CM | POA: Diagnosis not present

## 2020-08-16 DIAGNOSIS — C44319 Basal cell carcinoma of skin of other parts of face: Secondary | ICD-10-CM | POA: Diagnosis not present

## 2020-08-16 DIAGNOSIS — L578 Other skin changes due to chronic exposure to nonionizing radiation: Secondary | ICD-10-CM

## 2020-08-16 DIAGNOSIS — D489 Neoplasm of uncertain behavior, unspecified: Secondary | ICD-10-CM

## 2020-08-16 HISTORY — DX: Basal cell carcinoma of skin, unspecified: C44.91

## 2020-08-16 NOTE — Patient Instructions (Addendum)
Recommend daily broad spectrum sunscreen SPF 30+ to sun-exposed areas, reapply every 2 hours as needed. Call for new or changing lesions.  Prior to procedure, discussed risks of blister formation, small wound, skin dyspigmentation, or rare scar following cryotherapy.  Liquid nitrogen was applied for 10-12 seconds to the skin lesion and the expected blistering or scabbing reaction explained. Do not pick at the area. Patient reminded to expect hypopigmented scars from the procedure. Return if lesion fails to fully resolve.  Cryotherapy Aftercare  . Wash gently with soap and water everyday.   Marland Kitchen Apply Vaseline and Band-Aid daily until healed.   Seborrheic Keratosis  What causes seborrheic keratoses? Seborrheic keratoses are harmless, common skin growths that first appear during adult life.  As time goes by, more growths appear.  Some people may develop a large number of them.  Seborrheic keratoses appear on both covered and uncovered body parts.  They are not caused by sunlight.  The tendency to develop seborrheic keratoses can be inherited.  They vary in color from skin-colored to gray, brown, or even black.  They can be either smooth or have a rough, warty surface.   Seborrheic keratoses are superficial and look as if they were stuck on the skin.  Under the microscope this type of keratosis looks like layers upon layers of skin.  That is why at times the top layer may seem to fall off, but the rest of the growth remains and re-grows.    Treatment Seborrheic keratoses do not need to be treated, but can easily be removed in the office.  Seborrheic keratoses often cause symptoms when they rub on clothing or jewelry.  Lesions can be in the way of shaving.  If they become inflamed, they can cause itching, soreness, or burning.  Removal of a seborrheic keratosis can be accomplished by freezing, burning, or surgery. If any spot bleeds, scabs, or grows rapidly, please return to have it checked, as these can  be an indication of a skin cancer.  Wound Care Instructions  1. Cleanse wound gently with soap and water once a day then pat dry with clean gauze. Apply a thing coat of Petrolatum (petroleum jelly, "Vaseline") over the wound (unless you have an allergy to this). We recommend that you use a new, sterile tube of Vaseline. Do not pick or remove scabs. Do not remove the yellow or white "healing tissue" from the base of the wound.  2. Cover the wound with fresh, clean, nonstick gauze and secure with paper tape. You may use Band-Aids in place of gauze and tape if the would is small enough, but would recommend trimming much of the tape off as there is often too much. Sometimes Band-Aids can irritate the skin.  3. You should call the office for your biopsy report after 1 week if you have not already been contacted.  4. If you experience any problems, such as abnormal amounts of bleeding, swelling, significant bruising, significant pain, or evidence of infection, please call the office immediately.  5. FOR ADULT SURGERY PATIENTS: If you need something for pain relief you may take 1 extra strength Tylenol (acetaminophen) AND 2 Ibuprofen (200mg  each) together every 4 hours as needed for pain. (do not take these if you are allergic to them or if you have a reason you should not take them.) Typically, you may only need pain medication for 1 to 3 days.

## 2020-08-16 NOTE — Progress Notes (Signed)
New Patient Visit  Subjective  Hailey Wall is a 74 y.o. female who presents for the following: New Patient (Initial Visit) for few spots.  Patient presents today as new patient wanting to re-establish here, has some areas of concern on her B/L lower legs, Left upper arm, and under breasts. Some are itchy and bothersome. They have been present for months to years.  She would like to have all these areas removed. Patient does not have h/o of skin cancer.   The following portions of the chart were reviewed this encounter and updated as appropriate:  Tobacco  Allergies  Meds  Problems  Med Hx  Surg Hx  Fam Hx      Review of Systems:  No other skin or systemic complaints except as noted in HPI or Assessment and Plan.  Objective  Well appearing patient in no apparent distress; mood and affect are within normal limits.  An examination was performed including lower extremities, including the legs, feet, toes, and toenails and Under breasts, back, Left upper arm, face, eyelids, ears, neck, chest. Relevant physical exam findings are noted in the Assessment and Plan.  Objective  Left distal shin: 0.4 cm brown thin papule  Objective  Right lateral brow: 0.4 cm pink papule  Objective  Bilateral Inframammary Fold x3 (3), Right pretibia: Erythematous keratotic or waxy stuck-on papule or plaque.    Assessment & Plan  Neoplasm of uncertain behavior (2) Left distal shin  Right lateral brow  Skin / nail biopsy Type of biopsy: tangential   Informed consent: discussed and consent obtained   Timeout: patient name, date of birth, surgical site, and procedure verified   Procedure prep:  Patient was prepped and draped in usual sterile fashion Prep type:  Isopropyl alcohol Anesthesia: the lesion was anesthetized in a standard fashion   Anesthetic:  1% lidocaine w/ epinephrine 1-100,000 buffered w/ 8.4% NaHCO3 Instrument used: flexible razor blade   Hemostasis achieved with:  aluminum chloride   Outcome: patient tolerated procedure well   Post-procedure details: sterile dressing applied and wound care instructions given   Dressing type: bandage (mupirocin)    Specimen 1 - Surgical pathology Differential Diagnosis: R/O BCC Check Margins: No 0.4 cm pink papule   Left Distal Shin:  Consistent with dermatofibroma vs nevus Benign-appearing.  Observation.  Call clinic for new or changing moles.  Recommend daily use of broad spectrum spf 30+ sunscreen to sun-exposed areas.    Right Lateral Brow: Shave biopsy today  Inflamed seborrheic keratosis (4) Right pretibia; Bilateral Inframammary Fold x3 (3)  Cryotherapy today Prior to procedure, discussed risks of blister formation, small wound, skin dyspigmentation, or rare scar following cryotherapy.    Destruction of lesion - Bilateral Inframammary Fold x3  Destruction method: cryotherapy   Informed consent: discussed and consent obtained   Lesion destroyed using liquid nitrogen: Yes   Outcome: patient tolerated procedure well with no complications   Post-procedure details: wound care instructions given    Seborrheic Keratoses - Stuck-on, waxy, tan-brown papules and plaques  - Discussed benign etiology and prognosis. - Observe - Call for any changes  Actinic Damage - diffuse scaly erythematous macules with underlying dyspigmentation - Recommend daily broad spectrum sunscreen SPF 30+ to sun-exposed areas, reapply every 2 hours as needed.  - Call for new or changing lesions.  Return in about 3 months (around 11/16/2020) for TBSE.  I, Donzetta Kohut, CMA, am acting as scribe for Forest Gleason, MD .  Documentation: I have reviewed the  above documentation for accuracy and completeness, and I agree with the above.  Forest Gleason, MD

## 2020-08-21 ENCOUNTER — Encounter: Payer: Self-pay | Admitting: Dermatology

## 2020-08-21 NOTE — Progress Notes (Signed)
Skin , right lateral brow BASAL CELL CARCINOMA, NODULAR PATTERN, BASE INVOLVED --> recommend Mohs surgery at the [place], please refer. Thank you!

## 2020-08-23 DIAGNOSIS — J301 Allergic rhinitis due to pollen: Secondary | ICD-10-CM | POA: Diagnosis not present

## 2020-08-28 ENCOUNTER — Other Ambulatory Visit: Payer: Self-pay

## 2020-08-28 ENCOUNTER — Ambulatory Visit (INDEPENDENT_AMBULATORY_CARE_PROVIDER_SITE_OTHER): Payer: PPO | Admitting: Vascular Surgery

## 2020-08-28 ENCOUNTER — Encounter (INDEPENDENT_AMBULATORY_CARE_PROVIDER_SITE_OTHER): Payer: Self-pay | Admitting: Vascular Surgery

## 2020-08-28 VITALS — BP 139/75 | HR 78 | Ht 66.0 in | Wt 151.0 lb

## 2020-08-28 DIAGNOSIS — I83813 Varicose veins of bilateral lower extremities with pain: Secondary | ICD-10-CM

## 2020-08-28 DIAGNOSIS — I10 Essential (primary) hypertension: Secondary | ICD-10-CM | POA: Diagnosis not present

## 2020-08-28 NOTE — Assessment & Plan Note (Signed)
blood pressure control important in reducing the progression of atherosclerotic disease. On appropriate oral medications.  
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
## 2020-08-28 NOTE — Assessment & Plan Note (Signed)
The patient does have a history of significant venous disease requiring multiple interventions on the right leg.  She does still have pain in that leg.  I think it would be reasonable to assess her again with a venous reflux study in the near future at her convenience.  Its been 2 to 3 years since we have checked 1 of these and she does have a significant history of venous disease.  This will be done in the near future at her convenience.  We will see her back following the study to discuss the results and determine further treatment options.

## 2020-08-30 DIAGNOSIS — J301 Allergic rhinitis due to pollen: Secondary | ICD-10-CM | POA: Diagnosis not present

## 2020-08-31 DIAGNOSIS — J301 Allergic rhinitis due to pollen: Secondary | ICD-10-CM | POA: Diagnosis not present

## 2020-09-04 ENCOUNTER — Other Ambulatory Visit: Payer: Self-pay

## 2020-09-04 DIAGNOSIS — C4491 Basal cell carcinoma of skin, unspecified: Secondary | ICD-10-CM

## 2020-09-04 NOTE — Progress Notes (Signed)
Referral to [facility].

## 2020-09-06 DIAGNOSIS — J301 Allergic rhinitis due to pollen: Secondary | ICD-10-CM | POA: Diagnosis not present

## 2020-09-11 ENCOUNTER — Other Ambulatory Visit: Payer: Self-pay

## 2020-09-11 ENCOUNTER — Ambulatory Visit (INDEPENDENT_AMBULATORY_CARE_PROVIDER_SITE_OTHER): Payer: PPO | Admitting: Psychiatry

## 2020-09-11 DIAGNOSIS — F4312 Post-traumatic stress disorder, chronic: Secondary | ICD-10-CM
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
## 2020-09-13 DIAGNOSIS — J301 Allergic rhinitis due to pollen: Secondary | ICD-10-CM | POA: Diagnosis not present

## 2020-09-18 ENCOUNTER — Encounter (HOSPITAL_COMMUNITY): Payer: Self-pay | Admitting: Psychiatry

## 2020-09-18 DIAGNOSIS — Z1211 Encounter for screening for malignant neoplasm of colon: Secondary | ICD-10-CM | POA: Diagnosis not present

## 2020-09-18 DIAGNOSIS — N951 Menopausal and female climacteric states: Secondary | ICD-10-CM | POA: Diagnosis not present

## 2020-09-18 DIAGNOSIS — I10 Essential (primary) hypertension: Secondary | ICD-10-CM | POA: Diagnosis not present

## 2020-09-18 DIAGNOSIS — M810 Age-related osteoporosis without current pathological fracture: Secondary | ICD-10-CM | POA: Diagnosis not present

## 2020-09-18 DIAGNOSIS — F419 Anxiety disorder, unspecified: Secondary | ICD-10-CM | POA: Diagnosis not present

## 2020-09-18 DIAGNOSIS — E785 Hyperlipidemia, unspecified: Secondary | ICD-10-CM | POA: Diagnosis not present

## 2020-09-18 DIAGNOSIS — Z1389 Encounter for screening for other disorder: Secondary | ICD-10-CM | POA: Diagnosis not present

## 2020-09-18 DIAGNOSIS — F4310 Post-traumatic stress disorder, unspecified: Secondary | ICD-10-CM | POA: Diagnosis not present

## 2020-09-18 DIAGNOSIS — J309 Allergic rhinitis, unspecified: Secondary | ICD-10-CM | POA: Diagnosis not present

## 2020-09-18 DIAGNOSIS — Z23 Encounter for immunization: Secondary | ICD-10-CM | POA: Diagnosis not present

## 2020-09-18 DIAGNOSIS — Z0000 Encounter for general adult medical examination without abnormal findings: Secondary | ICD-10-CM | POA: Diagnosis not present

## 2020-09-18 DIAGNOSIS — J329 Chronic sinusitis, unspecified: Secondary | ICD-10-CM | POA: Diagnosis not present

## 2020-09-19 ENCOUNTER — Other Ambulatory Visit: Payer: Self-pay | Admitting: Family Medicine

## 2020-09-19 DIAGNOSIS — M810 Age-related osteoporosis without current pathological fracture: Secondary | ICD-10-CM

## 2020-09-20 DIAGNOSIS — J301 Allergic rhinitis due to pollen: Secondary | ICD-10-CM | POA: Diagnosis not present

## 2020-10-02 DIAGNOSIS — H903 Sensorineural hearing loss, bilateral: Secondary | ICD-10-CM | POA: Diagnosis not present

## 2020-10-02 DIAGNOSIS — H7202 Central perforation of tympanic membrane, left ear: Secondary | ICD-10-CM | POA: Diagnosis not present

## 2020-10-02 DIAGNOSIS — J301 Allergic rhinitis due to pollen: Secondary | ICD-10-CM | POA: Diagnosis not present

## 2020-10-02 DIAGNOSIS — H90A32 Mixed conductive and sensorineural hearing loss, unilateral, left ear with restricted hearing on the contralateral side: Secondary | ICD-10-CM | POA: Diagnosis not present

## 2020-10-02 DIAGNOSIS — H6122 Impacted cerumen, left ear: Secondary | ICD-10-CM | POA: Diagnosis not present

## 2020-10-02 DIAGNOSIS — H6981 Other specified disorders of Eustachian tube, right ear: Secondary | ICD-10-CM | POA: Diagnosis not present

## 2020-10-03 DIAGNOSIS — C44319 Basal cell carcinoma of skin of other parts of face: Secondary | ICD-10-CM | POA: Diagnosis not present

## 2020-10-04 DIAGNOSIS — J301 Allergic rhinitis due to pollen: Secondary | ICD-10-CM | POA: Diagnosis not present

## 2020-10-09 ENCOUNTER — Ambulatory Visit (INDEPENDENT_AMBULATORY_CARE_PROVIDER_SITE_OTHER): Payer: PPO

## 2020-10-09 ENCOUNTER — Encounter (INDEPENDENT_AMBULATORY_CARE_PROVIDER_SITE_OTHER): Payer: Self-pay | Admitting: Vascular Surgery

## 2020-10-09 ENCOUNTER — Other Ambulatory Visit: Payer: Self-pay

## 2020-10-09 ENCOUNTER — Ambulatory Visit (INDEPENDENT_AMBULATORY_CARE_PROVIDER_SITE_OTHER): Payer: PPO | Admitting: Vascular Surgery

## 2020-10-09 VITALS — BP 134/79 | HR 70 | Ht 67.0 in | Wt 151.0 lb

## 2020-10-09 DIAGNOSIS — I83813 Varicose veins of bilateral lower extremities with pain: Secondary | ICD-10-CM | POA: Diagnosis not present

## 2020-10-09 DIAGNOSIS — I10 Essential (primary) hypertension: Secondary | ICD-10-CM
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
## 2020-10-11 DIAGNOSIS — J301 Allergic rhinitis due to pollen: Secondary | ICD-10-CM | POA: Diagnosis not present

## 2020-10-16 ENCOUNTER — Ambulatory Visit (HOSPITAL_COMMUNITY): Payer: PPO | Admitting: Psychiatry

## 2020-10-18 DIAGNOSIS — J301 Allergic rhinitis due to pollen: Secondary | ICD-10-CM | POA: Diagnosis not present

## 2020-10-19 DIAGNOSIS — N390 Urinary tract infection, site not specified: Secondary | ICD-10-CM | POA: Diagnosis not present

## 2020-10-25 DIAGNOSIS — J301 Allergic rhinitis due to pollen: Secondary | ICD-10-CM | POA: Diagnosis not present

## 2020-11-01 DIAGNOSIS — J301 Allergic rhinitis due to pollen: Secondary | ICD-10-CM | POA: Diagnosis not present

## 2020-11-06 ENCOUNTER — Other Ambulatory Visit: Payer: Self-pay | Admitting: Obstetrics & Gynecology

## 2020-11-08 DIAGNOSIS — J301 Allergic rhinitis due to pollen: Secondary | ICD-10-CM | POA: Diagnosis not present

## 2020-11-15 ENCOUNTER — Other Ambulatory Visit: Payer: Self-pay

## 2020-11-15 ENCOUNTER — Ambulatory Visit: Payer: PPO | Admitting: Dermatology

## 2020-11-15 DIAGNOSIS — L570 Actinic keratosis: Secondary | ICD-10-CM

## 2020-11-15 DIAGNOSIS — D229 Melanocytic nevi, unspecified: Secondary | ICD-10-CM

## 2020-11-15 DIAGNOSIS — C44729 Squamous cell carcinoma of skin of left lower limb, including hip: Secondary | ICD-10-CM | POA: Diagnosis not present

## 2020-11-15 DIAGNOSIS — L814 Other melanin hyperpigmentation: Secondary | ICD-10-CM | POA: Diagnosis not present

## 2020-11-15 DIAGNOSIS — D1800 Hemangioma unspecified site: Secondary | ICD-10-CM | POA: Diagnosis not present

## 2020-11-15 DIAGNOSIS — D485 Neoplasm of uncertain behavior of skin: Secondary | ICD-10-CM

## 2020-11-15 DIAGNOSIS — L821 Other seborrheic keratosis: Secondary | ICD-10-CM

## 2020-11-15 DIAGNOSIS — L578 Other skin changes due to chronic exposure to nonionizing radiation: Secondary | ICD-10-CM | POA: Diagnosis not present

## 2020-11-15 DIAGNOSIS — L820 Inflamed seborrheic keratosis: Secondary | ICD-10-CM

## 2020-11-15 DIAGNOSIS — L720 Epidermal cyst: Secondary | ICD-10-CM | POA: Diagnosis not present

## 2020-11-15 DIAGNOSIS — Z1283 Encounter for screening for malignant neoplasm of skin: Secondary | ICD-10-CM | POA: Diagnosis not present

## 2020-11-15 DIAGNOSIS — C4492 Squamous cell carcinoma of skin, unspecified: Secondary | ICD-10-CM

## 2020-11-15 DIAGNOSIS — Z85828 Personal history of other malignant neoplasm of skin: Secondary | ICD-10-CM | POA: Diagnosis not present

## 2020-11-15 DIAGNOSIS — J301 Allergic rhinitis due to pollen: Secondary | ICD-10-CM | POA: Diagnosis not present

## 2020-11-15 HISTORY — DX: Squamous cell carcinoma of skin, unspecified: C44.92

## 2020-11-15 NOTE — Progress Notes (Signed)
Follow-Up Visit   Subjective  Hailey Wall is a 74 y.o. female who presents for the following: Annual Exam (TSBE today. Hx of BCC, treated with [facility]).  She notes a spot at her right cheek that is white and irritated.  She also has a couple of itchy spots on her left leg.  The following portions of the chart were reviewed this encounter and updated as appropriate: Tobacco  Allergies  Meds  Problems  Med Hx  Surg Hx  Fam Hx     Review of Systems: No other skin or systemic complaints except as noted in HPI or Assessment and Plan.   Objective  Well appearing patient in no apparent distress; mood and affect are within normal limits.  A full examination was performed including scalp, head, eyes, ears, nose, lips, neck, chest, axillae, abdomen, back, buttocks, bilateral upper extremities, bilateral lower extremities, hands, feet, fingers, toes, fingernails, and toenails. All findings within normal limits unless otherwise noted below.  Objective  Left Elbow: Subcutaneous nodule.   Objective  right cheek: Smooth white papule  Objective  left medial calf x 1, left lateral thigh x 1 (2): Erythematous keratotic or waxy stuck-on papule or plaque.   Objective  left calf: 0.4 cm pink papule R/o BCC vs other       Objective  Left Preauricular Area: Erythematous thin papule with gritty scale.   Assessment & Plan  Epidermal cyst Left Elbow  Benign-appearing.  Observation.  Call clinic for new or changing lesions.  Discussed option of excision if this becomes bothersome.  Milia right cheek  Acne/Milia surgery - right cheek Procedure risks and benefits were discussed with the patient and verbal consent was obtained. Following prep of the skin on the right cheek with an alcohol swab, extraction of milia was performed with a comedone extractor following superficial incision made over their surfaces with a #15 surgical blade. Capillary hemostasis was achieved with 20%  aluminum chloride solution. Vaseline ointment was applied to each site. The patient tolerated the procedure well.  Inflamed seborrheic keratosis (2) left medial calf x 1, left lateral thigh x 1  Prior to procedure, discussed risks of blister formation, small wound, skin dyspigmentation, or rare scar following cryotherapy.    Destruction of lesion - left medial calf x 1, left lateral thigh x 1  Destruction method: cryotherapy   Informed consent: discussed and consent obtained   Lesion destroyed using liquid nitrogen: Yes   Outcome: patient tolerated procedure well with no complications   Post-procedure details: wound care instructions given    Neoplasm of uncertain behavior of skin left calf  Skin / nail biopsy Type of biopsy: tangential   Informed consent: discussed and consent obtained   Timeout: patient name, date of birth, surgical site, and procedure verified   Procedure prep:  Patient was prepped and draped in usual sterile fashion Prep type:  Isopropyl alcohol Anesthesia: the lesion was anesthetized in a standard fashion   Anesthetic:  1% lidocaine w/ epinephrine 1-100,000 buffered w/ 8.4% NaHCO3 Instrument used: flexible razor blade   Hemostasis achieved with: pressure, aluminum chloride and electrodesiccation   Outcome: patient tolerated procedure well   Post-procedure details: sterile dressing applied and wound care instructions given   Dressing type: bandage and petrolatum    Specimen 1 - Surgical pathology Differential Diagnosis: R/o BCC vs other Check Margins: No 0.4 cm pink papule   AK (actinic keratosis) Left Preauricular Area  Prior to procedure, discussed risks of blister formation, small  wound, skin dyspigmentation, or rare scar following cryotherapy.    Destruction of lesion - Left Preauricular Area Complexity: simple   Destruction method: cryotherapy   Informed consent: discussed and consent obtained   Timeout:  patient name, date of birth, surgical  site, and procedure verified Lesion destroyed using liquid nitrogen: Yes   Region frozen until ice ball extended beyond lesion: Yes   Outcome: patient tolerated procedure well with no complications   Post-procedure details: wound care instructions given     Lentigines - Scattered tan macules - Discussed due to sun exposure - Benign, observe - Call for any changes  Seborrheic Keratoses - Stuck-on, waxy, tan-brown papules and plaques  - Discussed benign etiology and prognosis. - Observe - Call for any changes  Melanocytic Nevi - Tan-brown and/or pink-flesh-colored symmetric macules and papules - Benign appearing on exam today - Observation - Call clinic for new or changing moles - Recommend daily use of broad spectrum spf 30+ sunscreen to sun-exposed areas.   Hemangiomas - Red papules - Discussed benign nature - Observe - Call for any changes  Actinic Damage - Chronic, secondary to cumulative UV/sun exposure - diffuse scaly erythematous macules with underlying dyspigmentation - Recommend daily broad spectrum sunscreen SPF 30+ to sun-exposed areas, reapply every 2 hours as needed.  - Call for new or changing lesions.  Skin cancer screening performed today.  History of Basal Cell Carcinoma of the Skin - Right lateral brow 08/16/2020 s/p Mohs - No evidence of recurrence today - Recommend regular full body skin exams - Recommend daily broad spectrum sunscreen SPF 30+ to sun-exposed areas, reapply every 2 hours as needed.  - Call if any new or changing lesions are noted between office visits   Return in about 3 months (around 02/15/2021) for recheck AK; 6 months FBSE.   I, Harriett Sine, CMA, am acting as scribe for Forest Gleason, MD.  Documentation: I have reviewed the above documentation for accuracy and completeness, and I agree with the above.  Forest Gleason, MD

## 2020-11-15 NOTE — Patient Instructions (Addendum)
Recommend Vanicream Facial moisturizer.  I also recommend the vanicream moisturizing cream for the body (ok for face if it's not too thick).   Dr. Alfonso Patten [facility] [phone]    Gentle Skin Care Guide  1. Bathe no more than once a day.  2. Avoid bathing in hot water  3. Use a mild soap like Dove, Vanicream, Cetaphil, CeraVe. Can use Lever 2000 or Cetaphil antibacterial soap  4. Use soap only where you need it. On most days, use it under your arms, between your legs, and on your feet. Let the water rinse other areas unless visibly dirty.  5. When you get out of the bath/shower, use a towel to gently blot your skin dry, don't rub it.  6. While your skin is still a little damp, apply a moisturizing cream such as Vanicream, CeraVe, Cetaphil, Eucerin, Sarna lotion or plain Vaseline Jelly. For hands apply Neutrogena [place] Hand Cream or Excipial Hand Cream.  7. Reapply moisturizer any time you start to itch or feel dry.  8. Sometimes using free and clear laundry detergents can be helpful. Fabric softener sheets should be avoided. Downy Free & Gentle liquid, or any liquid fabric softener that is free of dyes and perfumes, it acceptable to use  9. If your doctor has given you prescription creams you may apply moisturizers over them

## 2020-11-18 ENCOUNTER — Encounter: Payer: Self-pay | Admitting: Dermatology

## 2020-11-20 ENCOUNTER — Telehealth: Payer: Self-pay

## 2020-11-20 NOTE — Telephone Encounter (Signed)
Patient advised of biopsy results and scheduled for surgery. °

## 2020-11-20 NOTE — Progress Notes (Signed)
Skin , left calf WELL DIFFERENTIATED SQUAMOUS CELL CARCINOMA --> excision recommended (~92-93% cure rate) over ED&C (~85% cure rate)  Dr. Laurence Ferrari called 11/20/2020, no answer, left voicemail.   MAs please call starting 11/21/2020. Let Dr. Jerilynn Mages know if she has any questions. Thank you!

## 2020-11-29 DIAGNOSIS — J301 Allergic rhinitis due to pollen: Secondary | ICD-10-CM | POA: Diagnosis not present

## 2020-11-30 ENCOUNTER — Other Ambulatory Visit: Payer: Self-pay | Admitting: Family Medicine

## 2020-11-30 DIAGNOSIS — Z1231 Encounter for screening mammogram for malignant neoplasm of breast: Secondary | ICD-10-CM

## 2020-11-30 DIAGNOSIS — J301 Allergic rhinitis due to pollen: Secondary | ICD-10-CM | POA: Diagnosis not present

## 2020-12-04 DIAGNOSIS — H6123 Impacted cerumen, bilateral: Secondary | ICD-10-CM | POA: Diagnosis not present

## 2020-12-04 DIAGNOSIS — H90A32 Mixed conductive and sensorineural hearing loss, unilateral, left ear with restricted hearing on the contralateral side: Secondary | ICD-10-CM | POA: Diagnosis not present

## 2020-12-04 DIAGNOSIS — H7202 Central perforation of tympanic membrane, left ear: Secondary | ICD-10-CM | POA: Diagnosis not present

## 2020-12-04 DIAGNOSIS — H6062 Unspecified chronic otitis externa, left ear: Secondary | ICD-10-CM | POA: Diagnosis not present

## 2020-12-06 DIAGNOSIS — J301 Allergic rhinitis due to pollen: Secondary | ICD-10-CM | POA: Diagnosis not present

## 2020-12-13 DIAGNOSIS — J301 Allergic rhinitis due to pollen: Secondary | ICD-10-CM | POA: Diagnosis not present

## 2020-12-25 ENCOUNTER — Ambulatory Visit
Admission: RE | Admit: 2020-12-25 | Discharge: 2020-12-25 | Disposition: A | Discharge status: Home / Self Care | Payer: PPO | Source: Ambulatory Visit | Attending: Family Medicine | Admitting: Family Medicine

## 2020-12-25 ENCOUNTER — Other Ambulatory Visit: Payer: Self-pay

## 2020-12-25 DIAGNOSIS — Z1231 Encounter for screening mammogram for malignant neoplasm of breast: Secondary | ICD-10-CM | POA: Insufficient documentation

## 2020-12-26 ENCOUNTER — Other Ambulatory Visit: Payer: Self-pay | Admitting: Family Medicine

## 2020-12-27 DIAGNOSIS — J329 Chronic sinusitis, unspecified: Secondary | ICD-10-CM | POA: Diagnosis not present

## 2021-01-01 ENCOUNTER — Other Ambulatory Visit: Payer: Self-pay | Admitting: Family Medicine

## 2021-01-01 DIAGNOSIS — H2513 Age-related nuclear cataract, bilateral: Secondary | ICD-10-CM | POA: Diagnosis not present

## 2021-01-01 DIAGNOSIS — R928 Other abnormal and inconclusive findings on diagnostic imaging of breast: Secondary | ICD-10-CM

## 2021-01-01 DIAGNOSIS — N6489 Other specified disorders of breast: Secondary | ICD-10-CM

## 2021-01-01 DIAGNOSIS — H524 Presbyopia: Secondary | ICD-10-CM | POA: Diagnosis not present

## 2021-01-02 ENCOUNTER — Other Ambulatory Visit: Payer: Self-pay

## 2021-01-02 ENCOUNTER — Ambulatory Visit
Admission: RE | Admit: 2021-01-02 | Discharge: 2021-01-02 | Disposition: A | Discharge status: Home / Self Care | Payer: PPO | Source: Ambulatory Visit | Attending: Family Medicine | Admitting: Family Medicine

## 2021-01-02 DIAGNOSIS — R928 Other abnormal and inconclusive findings on diagnostic imaging of breast: Secondary | ICD-10-CM | POA: Insufficient documentation

## 2021-01-02 DIAGNOSIS — N6489 Other specified disorders of breast: Secondary | ICD-10-CM | POA: Insufficient documentation

## 2021-01-02 DIAGNOSIS — R922 Inconclusive mammogram: Secondary | ICD-10-CM | POA: Diagnosis not present

## 2021-01-03 DIAGNOSIS — J301 Allergic rhinitis due to pollen: Secondary | ICD-10-CM | POA: Diagnosis not present

## 2021-01-10 DIAGNOSIS — J301 Allergic rhinitis due to pollen: Secondary | ICD-10-CM | POA: Diagnosis not present

## 2021-01-15 ENCOUNTER — Ambulatory Visit: Payer: PPO | Admitting: Dermatology

## 2021-01-15 ENCOUNTER — Other Ambulatory Visit: Payer: Self-pay

## 2021-01-15 DIAGNOSIS — C44729 Squamous cell carcinoma of skin of left lower limb, including hip: Secondary | ICD-10-CM | POA: Diagnosis not present

## 2021-01-15 DIAGNOSIS — L988 Other specified disorders of the skin and subcutaneous tissue: Secondary | ICD-10-CM | POA: Diagnosis not present

## 2021-01-15 DIAGNOSIS — C4492 Squamous cell carcinoma of skin, unspecified: Secondary | ICD-10-CM

## 2021-01-15 MED ORDER — MUPIROCIN 2 % EX OINT: 1.0000 "application " | TOPICAL_OINTMENT | Freq: Every day | CUTANEOUS | 0 refills | Status: DC

## 2021-01-15 NOTE — Progress Notes (Signed)
   Follow-Up Visit   Subjective  Hailey Wall is a 75 y.o. female who presents for the following: Procedure (Patient here today for excision of biopsy proven SCC at left calf. ).   The following portions of the chart were reviewed this encounter and updated as appropriate:   Tobacco  Allergies  Meds  Problems  Med Hx  Surg Hx  Fam Hx      Review of Systems:  No other skin or systemic complaints except as noted in HPI or Assessment and Plan.  Objective  Well appearing patient in no apparent distress; mood and affect are within normal limits.  A focused examination was performed including left calf. Relevant physical exam findings are noted in the Assessment and Plan.  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  Skin excision  Lesion length (cm):  0.8 Lesion width (cm):  0.8 Margin per side (cm):  0.5 Total excision diameter (cm):  1.8 Informed consent: discussed and consent obtained   Timeout: patient name, date of birth, surgical site, and procedure verified   Procedure prep:  Patient was prepped and draped in usual sterile fashion Prep type:  Chlorhexidine Anesthesia: the lesion was anesthetized in a standard fashion   Anesthesia comment:  14cc Anesthetic:  1% lidocaine w/ epinephrine 1-100,000 buffered w/ 8.4% NaHCO3 Instrument used: #15 blade   Hemostasis achieved with: suture, pressure and electrodesiccation   Outcome: patient tolerated procedure well with no complications   Post-procedure details: wound care instructions given   Additional details:  Mupirocin and a pressure dressing applied  Skin repair Complexity:  Complex Final length (cm):  6.9 Informed consent: discussed and consent obtained   Timeout: patient name, date of birth, surgical site, and procedure verified   Procedure prep:  Patient was prepped and draped in usual sterile fashion Prep type:  Chlorhexidine Anesthesia: the  lesion was anesthetized in a standard fashion   Anesthetic:  1% lidocaine w/ epinephrine 1-100,000 local infiltration Reason for type of repair: reduce tension to allow closure, reduce the risk of dehiscence, infection, and necrosis, allow closure of the large defect, allow side-to-side closure without requiring a flap or graft, compensate for the surrounding damaged skin and enhance both functionality and cosmetic results   Undermining: edges undermined   Subcutaneous layers (deep stitches):  Suture size:  3-0 Suture type: Vicryl (polyglactin 910)   Stitches:  Buried vertical mattress Fine/surface layer approximation (top stitches):  Suture size:  4-0 Suture type: Prolene (polypropylene)   Suture removal (days):  14 Hemostasis achieved with: suture, pressure and electrodesiccation Outcome: patient tolerated procedure well with no complications   Post-procedure details: wound care instructions given   Additional details:  Extensive undermining greater than the maximum width of the defect along at least one entire edge of the defect was performed Maximum width of defect perpendicular to line of the closure 1.8 cm Width of undermining done 2.3 cm  Mupirocin and a pressure dressing applied   Specimen 1 - Surgical pathology Differential Diagnosis: biopsy proven SCC  Check Margins: yes Healing biopsy site [id number]  Ordered Medications: mupirocin ointment (BACTROBAN) 2 %  Return in about 2 weeks (around 01/29/2021) for Suture Removal.  Graciella Belton, RMA, am acting as scribe for Forest Gleason, MD .  Documentation: I have reviewed the above documentation for accuracy and completeness, and I agree with the above.  Forest Gleason, MD

## 2021-01-15 NOTE — Patient Instructions (Signed)

## 2021-01-16 ENCOUNTER — Encounter: Payer: Self-pay | Admitting: Dermatology

## 2021-01-16 ENCOUNTER — Telehealth: Payer: Self-pay

## 2021-01-16 NOTE — Telephone Encounter (Signed)
Patient doing well after yesterdays surgery, JS 

## 2021-01-17 ENCOUNTER — Telehealth: Payer: Self-pay

## 2021-01-17 NOTE — Telephone Encounter (Signed)
Patient advised of information.

## 2021-01-17 NOTE — Telephone Encounter (Signed)
-----   Message from Alfonso Patten, MD sent at 01/16/2021  5:21 PM EST ----- Skin (M), left calf NO RESIDUAL SQUAMOUS CELL CARCINOMA, MARGINS FREE  Entire lesion appears to be out. No additional treatment needed at this time. We will see her back for suture removal as planned. Please call our office [phone] with any questions.    MAs please call. Thank you!

## 2021-01-17 NOTE — Telephone Encounter (Signed)
Left msg for patient advising margins free, will review at suture removal, JS

## 2021-01-24 DIAGNOSIS — J301 Allergic rhinitis due to pollen: Secondary | ICD-10-CM | POA: Diagnosis not present

## 2021-01-30 ENCOUNTER — Ambulatory Visit (INDEPENDENT_AMBULATORY_CARE_PROVIDER_SITE_OTHER): Payer: PPO | Admitting: Dermatology

## 2021-01-30 ENCOUNTER — Other Ambulatory Visit: Payer: Self-pay

## 2021-01-30 DIAGNOSIS — Z4802 Encounter for removal of sutures: Secondary | ICD-10-CM

## 2021-01-30 DIAGNOSIS — C44729 Squamous cell carcinoma of skin of left lower limb, including hip: Secondary | ICD-10-CM

## 2021-01-30 DIAGNOSIS — C4492 Squamous cell carcinoma of skin, unspecified: Secondary | ICD-10-CM

## 2021-01-30 MED ORDER — DOXYCYCLINE HYCLATE 100 MG PO TABS: 100.0000 mg | ORAL_TABLET | Freq: Two times a day (BID) | ORAL | 0 refills | Status: AC

## 2021-01-30 NOTE — Patient Instructions (Addendum)
Patient advised to keep steri-strips dry until they fall off. - Scars remodel for a full year. - Once steri-strips fall off, patient can apply over-the-counter silicone scar cream each night to help with scar remodeling if desired. call with any concerns or if they notice any new or changing lesions.  Doxycycline should be taken with food to prevent nausea. Do not lay down for 30 minutes after taking. Be cautious with sun exposure and use good sun protection while on this medication. Pregnant women should not take this medication.

## 2021-01-30 NOTE — Progress Notes (Signed)
   Follow-Up Visit   Subjective  Hailey Wall is a 75 y.o. female who presents   107for the following: Rupture of Membranes (Patient is here today for 2 week suture removal. Patient states she feels area is healing.).  The following portions of the chart were reviewed this encounter and updated as appropriate:  Tobacco  Allergies  Meds  Problems  Med Hx  Surg Hx  Fam Hx      Objective  Well appearing patient in no apparent distress; mood and affect are within normal limits.  A focused examination was performed including left calf . Relevant physical exam findings are noted in the Assessment and Plan.   Assessment & Plan  Squamous cell carcinoma of skin   Encounter for Removal of Sutures - Incision site at the left calf  is clean, dry and intact - Wound cleansed, sutures removed, wound cleansed and steri strips applied.  - Discussed pathology results showing margins free - Patient advised to keep steri-strips dry until they fall off. - Scars remodel for a full year. - Once steri-strips fall off, patient can apply over-the-counter silicone scar cream each night to help with scar remodeling if desired. - Patient advised to call with any concerns or if they notice any new or changing lesions. -Start doxycycline 100 mg tablet take 1 tablet by mouth twice daily with food for 1 week.   Return for keep follow up as scheduled 03/07/21.Garry Heater, CMA, am acting as scribe for Forest Gleason, MD.  Documentation: I have reviewed the above documentation for accuracy and completeness, and I agree with the above.  Forest Gleason, MD

## 2021-01-31 DIAGNOSIS — J301 Allergic rhinitis due to pollen: Secondary | ICD-10-CM | POA: Diagnosis not present

## 2021-02-01 ENCOUNTER — Ambulatory Visit (INDEPENDENT_AMBULATORY_CARE_PROVIDER_SITE_OTHER): Payer: PPO | Admitting: Obstetrics & Gynecology

## 2021-02-01 ENCOUNTER — Encounter: Payer: Self-pay | Admitting: Obstetrics & Gynecology

## 2021-02-01 ENCOUNTER — Other Ambulatory Visit: Payer: Self-pay

## 2021-02-01 VITALS — BP 122/80 | Ht 66.0 in | Wt 161.0 lb

## 2021-02-01 DIAGNOSIS — M810 Age-related osteoporosis without current pathological fracture: Secondary | ICD-10-CM | POA: Diagnosis not present

## 2021-02-01 DIAGNOSIS — N8111 Cystocele, midline: Secondary | ICD-10-CM

## 2021-02-01 DIAGNOSIS — N952 Postmenopausal atrophic vaginitis: Secondary | ICD-10-CM | POA: Diagnosis not present
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
## 2021-02-07 ENCOUNTER — Telehealth: Payer: Self-pay

## 2021-02-07 NOTE — Telephone Encounter (Signed)
-----   Message from Gae Dry, MD sent at 02/01/2021  8:38 AM EST ----- Regarding: DEXA Please schedule or have [place] call pt's cell to schedule (she works and cannot go just anytime so wants to be a part of the scheduling process)

## 2021-02-07 NOTE — Telephone Encounter (Signed)
Called pt and gave her the phone number to schedule her DEXA appt at [facility]

## 2021-02-12 DIAGNOSIS — H6062 Unspecified chronic otitis externa, left ear: Secondary | ICD-10-CM | POA: Diagnosis not present

## 2021-02-12 DIAGNOSIS — H7202 Central perforation of tympanic membrane, left ear: Secondary | ICD-10-CM | POA: Diagnosis not present

## 2021-02-12 DIAGNOSIS — R059 Cough, unspecified: Secondary | ICD-10-CM | POA: Diagnosis not present

## 2021-02-12 DIAGNOSIS — K219 Gastro-esophageal reflux disease without esophagitis: Secondary | ICD-10-CM | POA: Diagnosis not present

## 2021-02-12 DIAGNOSIS — H6123 Impacted cerumen, bilateral: Secondary | ICD-10-CM | POA: Diagnosis not present

## 2021-02-12 DIAGNOSIS — H90A32 Mixed conductive and sensorineural hearing loss, unilateral, left ear with restricted hearing on the contralateral side: Secondary | ICD-10-CM | POA: Diagnosis not present

## 2021-02-13 ENCOUNTER — Encounter: Payer: PPO | Admitting: Dermatology

## 2021-02-14 DIAGNOSIS — J301 Allergic rhinitis due to pollen: Secondary | ICD-10-CM | POA: Diagnosis not present

## 2021-02-22 DIAGNOSIS — J301 Allergic rhinitis due to pollen: Secondary | ICD-10-CM | POA: Diagnosis not present

## 2021-02-25 ENCOUNTER — Encounter: Payer: Self-pay | Admitting: Dermatology

## 2021-02-28 DIAGNOSIS — J301 Allergic rhinitis due to pollen: Secondary | ICD-10-CM | POA: Diagnosis not present

## 2021-03-06 ENCOUNTER — Other Ambulatory Visit: Payer: Self-pay | Admitting: Obstetrics & Gynecology

## 2021-03-06 ENCOUNTER — Other Ambulatory Visit: Payer: Self-pay

## 2021-03-06 ENCOUNTER — Ambulatory Visit
Admission: RE | Admit: 2021-03-06 | Discharge: 2021-03-06 | Disposition: A | Discharge status: Home / Self Care | Payer: PPO | Source: Ambulatory Visit | Attending: Obstetrics & Gynecology | Admitting: Obstetrics & Gynecology

## 2021-03-06 DIAGNOSIS — M810 Age-related osteoporosis without current pathological fracture: Secondary | ICD-10-CM | POA: Diagnosis not present

## 2021-03-06 DIAGNOSIS — Z780 Asymptomatic menopausal state: Secondary | ICD-10-CM | POA: Diagnosis not present

## 2021-03-06 NOTE — Progress Notes (Signed)
TELE: Bone density testing by DEXA testing is low which puts you at risk for osteoporosis and fracture.  You also have osteoporosis.  Compared to 2 years ago, there is slight improvements in both femur and wrist results.   I recommend you continue to take 1000 U per day of Vitamin D, 1500 mg per day of calcium supplement, and we can recheck it again in 2 years. Options for Bisphosphonate therapy includes changing to another pill or infusion therapy.  She refuses infusion or alternative therapies.  Will continue w Fosamax at this time.  She is on hormones as well for other symptoms.  Barnett Applebaum, MD, [facility], [facility] 03/06/2021  4:36 PM

## 2021-03-07 ENCOUNTER — Other Ambulatory Visit: Payer: Self-pay

## 2021-03-07 ENCOUNTER — Ambulatory Visit: Payer: PPO | Admitting: Dermatology

## 2021-03-07 DIAGNOSIS — L570 Actinic keratosis: Secondary | ICD-10-CM

## 2021-03-07 DIAGNOSIS — L738 Other specified follicular disorders: Secondary | ICD-10-CM | POA: Diagnosis not present

## 2021-03-07 DIAGNOSIS — Z1283 Encounter for screening for malignant neoplasm of skin: Secondary | ICD-10-CM | POA: Diagnosis not present

## 2021-03-07 DIAGNOSIS — D1800 Hemangioma unspecified site: Secondary | ICD-10-CM

## 2021-03-07 DIAGNOSIS — L814 Other melanin hyperpigmentation: Secondary | ICD-10-CM

## 2021-03-07 DIAGNOSIS — Z85828 Personal history of other malignant neoplasm of skin: Secondary | ICD-10-CM

## 2021-03-07 DIAGNOSIS — L578 Other skin changes due to chronic exposure to nonionizing radiation: Secondary | ICD-10-CM | POA: Diagnosis not present

## 2021-03-07 DIAGNOSIS — L821 Other seborrheic keratosis: Secondary | ICD-10-CM

## 2021-03-07 DIAGNOSIS — D229 Melanocytic nevi, unspecified: Secondary | ICD-10-CM

## 2021-03-07 NOTE — Progress Notes (Signed)
Follow-Up Visit   Subjective  Hailey Wall is a 75 y.o. female who presents for the following: TBSE (Patient here for full body skin exam and skin cancer screening. Patient does have a hx of SCC and BCC. She advises there is a spot at the right calf that she recently noticed. ).   The following portions of the chart were reviewed this encounter and updated as appropriate:   Tobacco  Allergies  Meds  Problems  Med Hx  Surg Hx  Fam Hx      Review of Systems:  No other skin or systemic complaints except as noted in HPI or Assessment and Plan.  Objective  Well appearing patient in no apparent distress; mood and affect are within normal limits.  A full examination was performed including scalp, head, eyes, ears, nose, lips, neck, chest, axillae, abdomen, back, buttocks, bilateral upper extremities, bilateral lower extremities, hands, feet, fingers, toes, fingernails, and toenails. All findings within normal limits unless otherwise noted below.  Objective  left preauricular: Erythematous thin papules/macules with gritty scale.   Objective  forehead, right cheek: Small yellow papules with a central dell.    Assessment & Plan  AK (actinic keratosis) left preauricular  Prior to procedure, discussed risks of blister formation, small wound, skin dyspigmentation, or rare scar following cryotherapy.    Destruction of lesion - left preauricular Complexity: simple   Destruction method: cryotherapy   Informed consent: discussed and consent obtained   Lesion destroyed using liquid nitrogen: Yes   Cryotherapy cycles:  2 Outcome: patient tolerated procedure well with no complications   Post-procedure details: wound care instructions given    Sebaceous hyperplasia forehead, right cheek  Benign-appearing.  Observation.  Call clinic for new or changing lesions.  Recommend daily use of broad spectrum spf 30+ sunscreen to sun-exposed areas.     Lentigines - Scattered tan  macules - Due to sun exposure - Benign-appering, observe - Recommend daily broad spectrum sunscreen SPF 30+ to sun-exposed areas, reapply every 2 hours as needed. - Call for any changes  Seborrheic Keratoses - Stuck-on, waxy, tan-brown papules and plaques  - Discussed benign etiology and prognosis. - Observe - Call for any changes  Melanocytic Nevi - Tan-brown and/or pink-flesh-colored symmetric macules and papules - Benign appearing on exam today - Observation - Call clinic for new or changing moles - Recommend daily use of broad spectrum spf 30+ sunscreen to sun-exposed areas.   Hemangiomas - Red papules - Discussed benign nature - Observe - Call for any changes  Actinic Damage - Chronic, secondary to cumulative UV/sun exposure - diffuse scaly erythematous macules with underlying dyspigmentation - Recommend daily broad spectrum sunscreen SPF 30+ to sun-exposed areas, reapply every 2 hours as needed.  - Call for new or changing lesions.  Skin cancer screening performed today.  History of Basal Cell Carcinoma of the Skin - No evidence of recurrence today at right lateral brow - Recommend regular full body skin exams - Recommend daily broad spectrum sunscreen SPF 30+ to sun-exposed areas, reapply every 2 hours as needed.  - Call if any new or changing lesions are noted between office visits  History of Squamous Cell Carcinoma of the Skin - No evidence of recurrence today at left calf - No lymphadenopathy - Recommend regular full body skin exams - Recommend daily broad spectrum sunscreen SPF 30+ to sun-exposed areas, reapply every 2 hours as needed.  - Call if any new or changing lesions are noted between office visits  Return in about 6 months (around 09/07/2021) for TBSE.  Graciella Belton, RMA, am acting as scribe for Forest Gleason, MD .  Documentation: I have reviewed the above documentation for accuracy and completeness, and I agree with the above.  Forest Gleason,  MD

## 2021-03-07 NOTE — Patient Instructions (Addendum)
Melanoma ABCDEs  Melanoma is the most dangerous type of skin cancer, and is the leading cause of death from skin disease.  You are more likely to develop melanoma if you:  Have light-colored skin, light-colored eyes, or red or blond hair  Spend a lot of time in the sun  Tan regularly, either outdoors or in a tanning bed  Have had blistering sunburns, especially during childhood  Have a close family member who has had a melanoma  Have atypical moles or large birthmarks  Early detection of melanoma is key since treatment is typically straightforward and cure rates are extremely high if we catch it early.   The first sign of melanoma is often a change in a mole or a new dark spot.  The ABCDE system is a way of remembering the signs of melanoma.  A for asymmetry:  The two halves do not match. B for border:  The edges of the growth are irregular. C for color:  A mixture of colors are present instead of an even brown color. D for diameter:  Melanomas are usually (but not always) greater than 33mm - the size of a pencil eraser. E for evolution:  The spot keeps changing in size, shape, and color.  Please check your skin once per month between visits. You can use a small mirror in front and a large mirror behind you to keep an eye on the back side or your body.   If you see any new or changing lesions before your next follow-up, please call to schedule a visit.  Please continue daily skin protection including broad spectrum sunscreen SPF 30+ to sun-exposed areas, reapplying every 2 hours as needed when you're outdoors.     Cryotherapy Aftercare  . Wash gently with soap and water everyday.   Marland Kitchen Apply Vaseline and Band-Aid daily until healed.  Prior to procedure, discussed risks of blister formation, small wound, skin dyspigmentation, or rare scar following cryotherapy.   Recommend Niacinamide (Nicotinamide) 500mg  twice per day to lower risk of non-melanoma skin cancer by approximately  25%.   Recommend taking Heliocare sun protection supplement daily in sunny weather for additional sun protection. For maximum protection on the sunniest days, you can take up to 2 capsules of regular Heliocare OR take 1 capsule of Heliocare Ultra. For prolonged exposure (such as a full day in the sun), you can repeat your dose of the supplement 4 hours after your first dose. Heliocare can be purchased at [facility] or at [email or web address].    If you have any questions or concerns for your doctor, please call our main line at [phone] and press option 4 to reach your doctor's medical assistant. If no one answers, please leave a voicemail as directed and we will return your call as soon as possible. Messages left after 4 pm will be answered the following business day.   You may also send [place] a message via [facility]. We typically respond to MyChart messages within 1-2 business days.  For prescription refills, please ask your pharmacy to contact our office. Our fax number is [phone].  If you have an urgent issue when the clinic is closed that cannot wait until the next business day, you can page your doctor at the number below.    Please note that while we do our best to be available for urgent issues outside of office hours, we are not available 24/7.   If you have an urgent issue and are unable to reach  [place], you may choose to seek medical care at your doctor's office, retail clinic, urgent care center, or emergency room.  If you have a medical emergency, please immediately call 911 or go to the emergency department.  Pager Numbers  - Dr. Nehemiah Massed: [phone]  - Dr. Laurence Ferrari: [phone]  - Dr. Nicole Kindred: [phone]  In the event of inclement weather, please call our main line at [phone] for an update on the status of any delays or closures.  Dermatology Medication Tips: Please keep the boxes that topical medications come in in order to help keep track of the  instructions about where and how to use these. Pharmacies typically print the medication instructions only on the boxes and not directly on the medication tubes.   If your medication is too expensive, please contact our office at [phone] option 4 or send [place] a message through [place].   We are unable to tell what your co-pay for medications will be in advance as this is different depending on your insurance coverage. However, we may be able to find a substitute medication at lower cost or fill out paperwork to get insurance to cover a needed medication.   If a prior authorization is required to get your medication covered by your insurance company, please allow [place] 1-2 business days to complete this process.  Drug prices often vary depending on where the prescription is filled and some pharmacies may offer cheaper prices.  The website www.goodrx.com contains coupons for medications through different pharmacies. The prices here do not account for what the cost may be with help from insurance (it may be cheaper with your insurance), but the website can give you the price if you did not use any insurance.  - You can print the associated coupon and take it with your prescription to the pharmacy.  - You may also stop by our office during regular business hours and pick up a GoodRx coupon card.  - If you need your prescription sent electronically to a different pharmacy, notify our office through [facility] or by phone at [phone] option 4.

## 2021-03-11 ENCOUNTER — Encounter: Payer: Self-pay | Admitting: Dermatology

## 2021-03-13 IMAGING — MG DIGITAL SCREENING BILAT W/ TOMO W/ CAD
8 series · 9 of 24 positions shown · non-contrast
Comparison: Previous exam(s).

CLINICAL DATA: Screening.

EXAM:
DIGITAL SCREENING BILATERAL MAMMOGRAM WITH TOMO AND CAD

[R CC synth-2D]
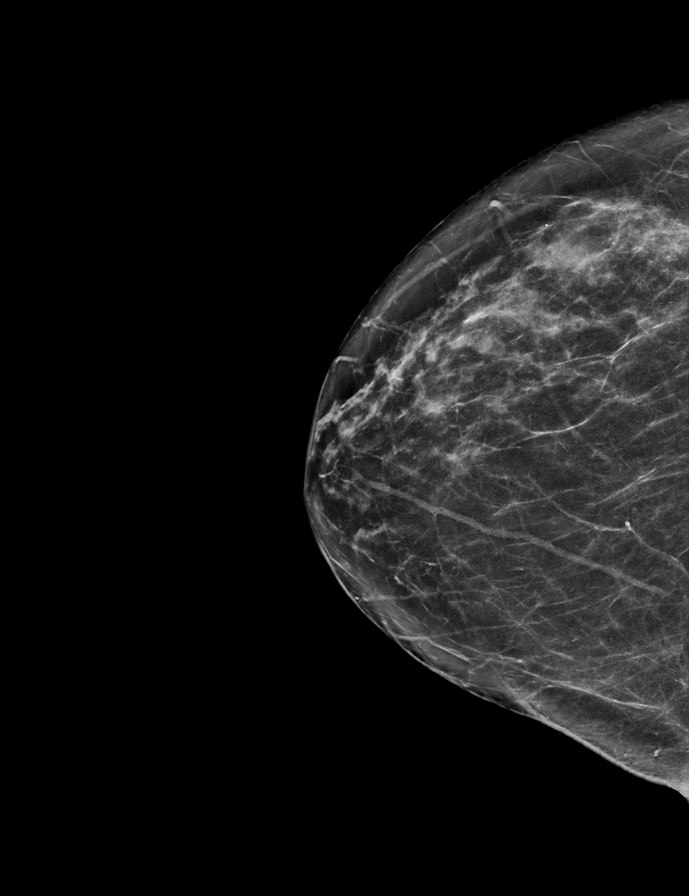

[L CC synth-2D]
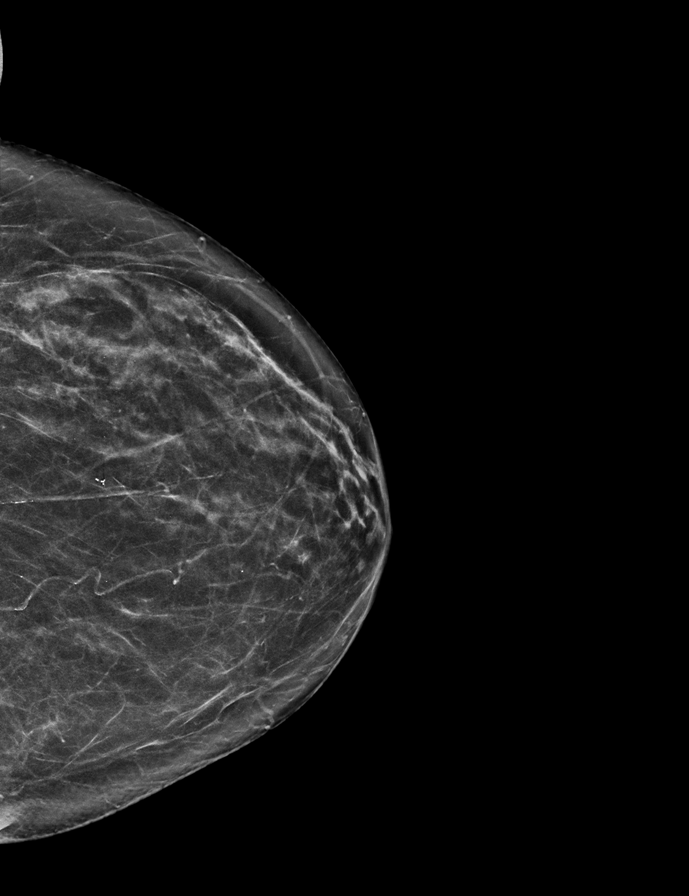

[L MLO synth-2D]
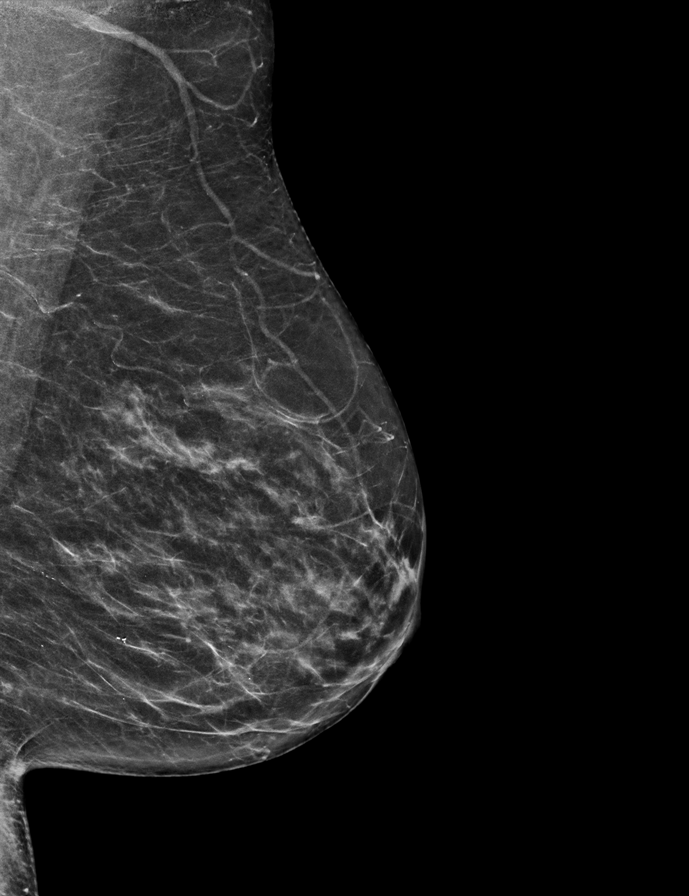

[R MLO synth-2D]
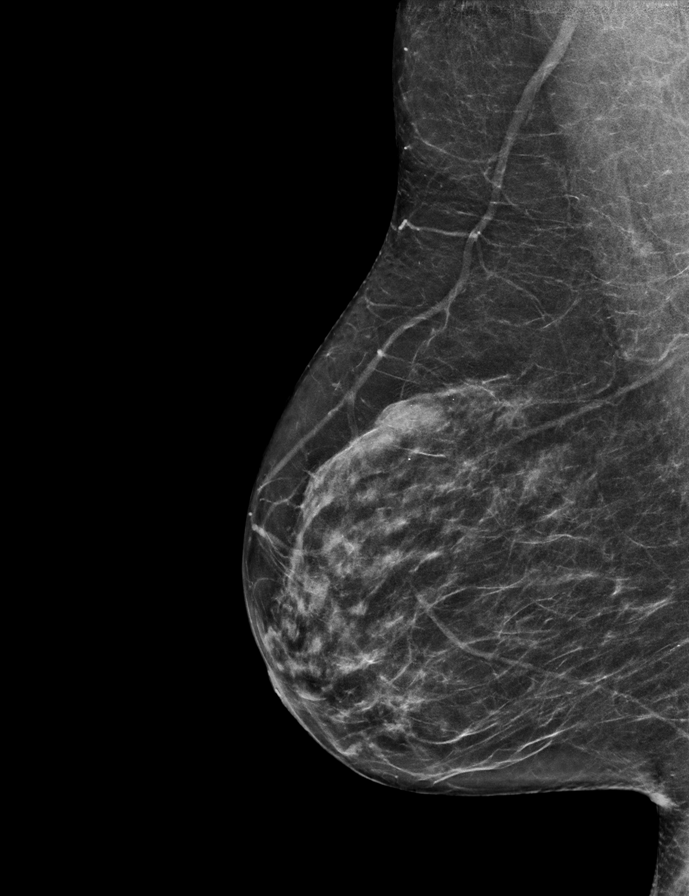

[L MLO tomo · 2 of 59 frames shown]
[frame 20/59]
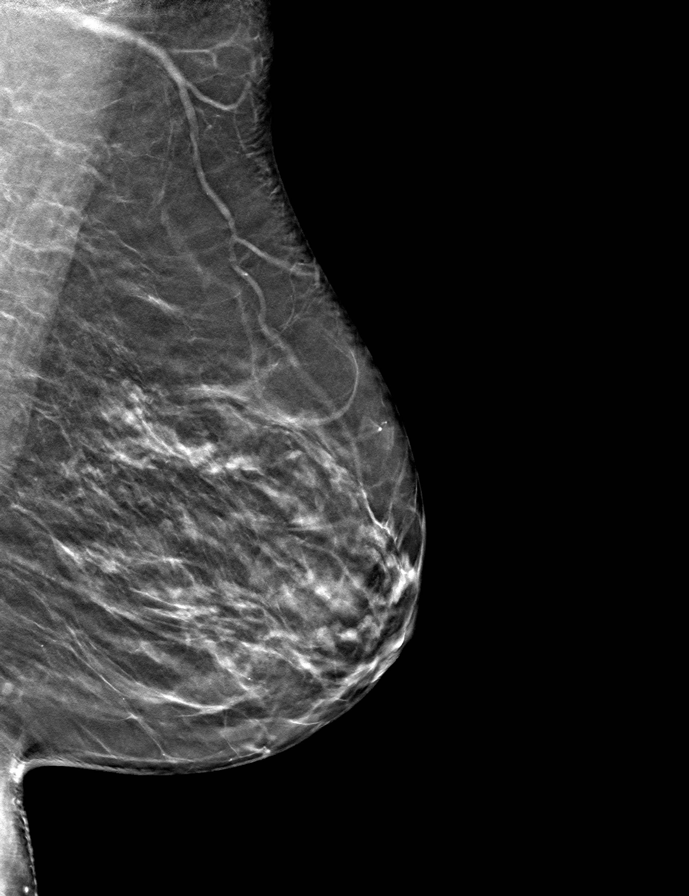
[frame 30/59]
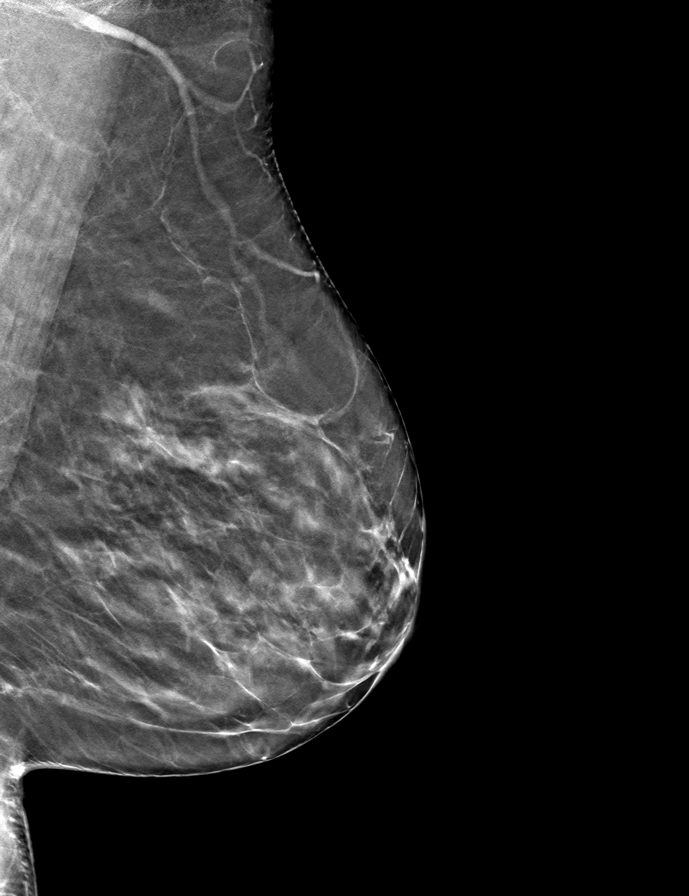

[R CC tomo · tomo slice 29/58.0]
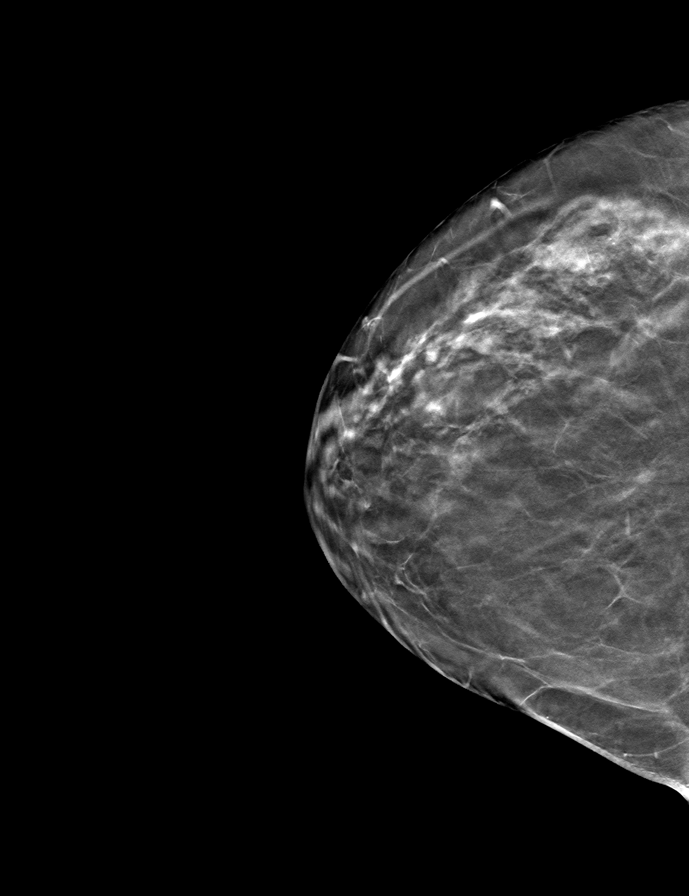

[L CC tomo · tomo slice 30/59.0]
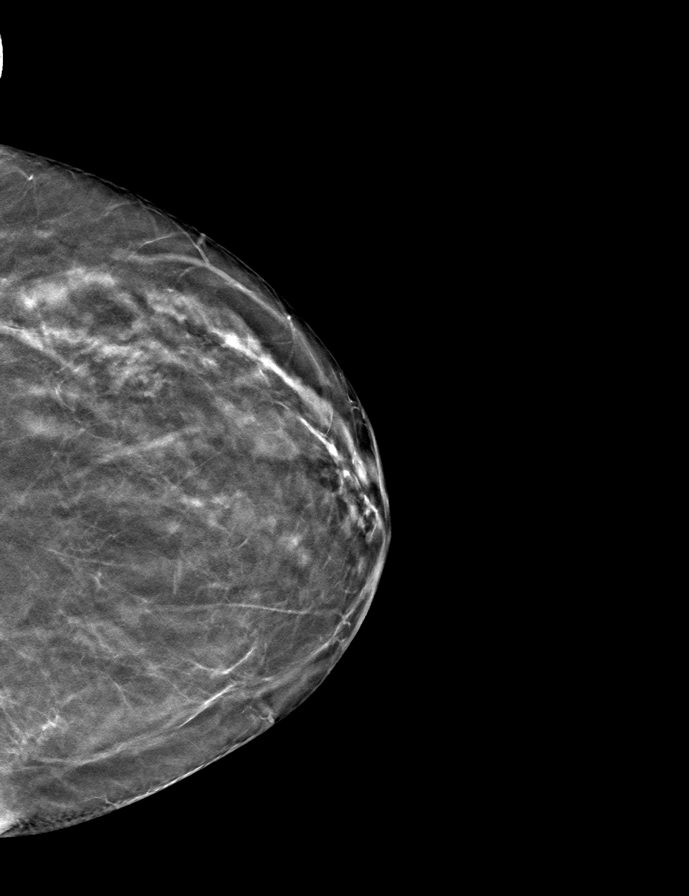

[R MLO tomo · tomo slice 30/59.0]
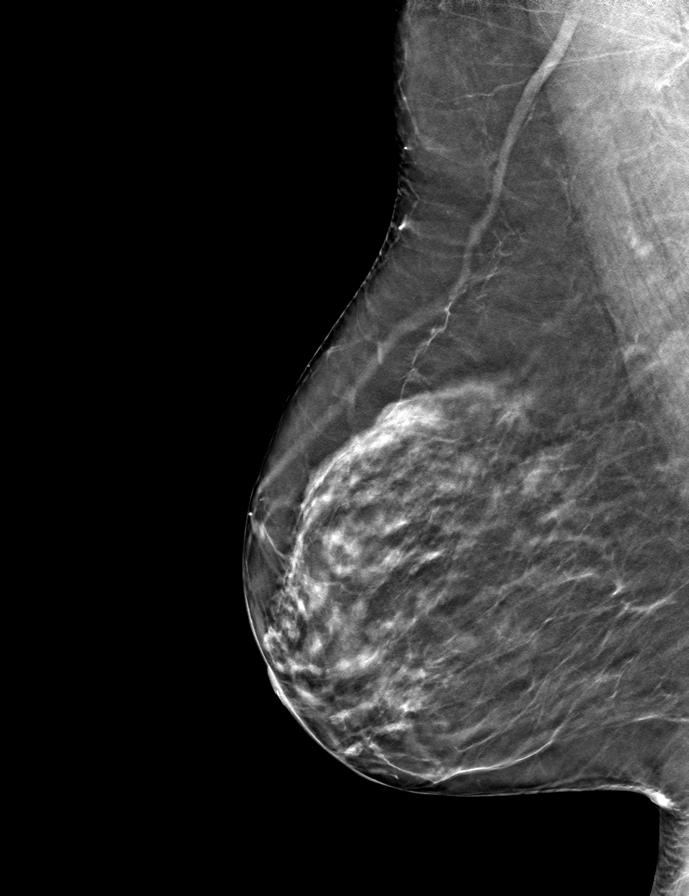

[9 of 24 positions shown; findings below may reference images not displayed]

ACR Breast Density Category c: The breast tissue is heterogeneously
dense, which may obscure small masses.
FINDINGS: In the right breast, a possible asymmetry warrants further
evaluation. In the left breast, no findings suspicious for
malignancy. Images were processed with CAD.
IMPRESSION: Further evaluation is suggested for possible asymmetry in the right
breast.

RECOMMENDATION:
Diagnostic mammogram and possibly ultrasound of the right breast.
(Code:EU-[id number])

The patient will be contacted regarding the findings, and additional
imaging will be scheduled.

BI-RADS CATEGORY  0: Incomplete. Need additional imaging evaluation
and/or prior mammograms for comparison.

## 2021-03-19 DIAGNOSIS — E785 Hyperlipidemia, unspecified: Secondary | ICD-10-CM | POA: Diagnosis not present

## 2021-03-19 DIAGNOSIS — F419 Anxiety disorder, unspecified: Secondary | ICD-10-CM | POA: Diagnosis not present

## 2021-03-19 DIAGNOSIS — I10 Essential (primary) hypertension: Secondary | ICD-10-CM | POA: Diagnosis not present

## 2021-03-19 DIAGNOSIS — M2550 Pain in unspecified joint: Secondary | ICD-10-CM | POA: Diagnosis not present

## 2021-03-21 DIAGNOSIS — J301 Allergic rhinitis due to pollen: Secondary | ICD-10-CM | POA: Diagnosis not present

## 2021-03-28 DIAGNOSIS — J301 Allergic rhinitis due to pollen: Secondary | ICD-10-CM | POA: Diagnosis not present

## 2021-04-04 DIAGNOSIS — J301 Allergic rhinitis due to pollen: Secondary | ICD-10-CM | POA: Diagnosis not present

## 2021-04-09 DIAGNOSIS — J320 Chronic maxillary sinusitis: Secondary | ICD-10-CM | POA: Diagnosis not present

## 2021-04-09 DIAGNOSIS — H7202 Central perforation of tympanic membrane, left ear: Secondary | ICD-10-CM | POA: Diagnosis not present

## 2021-04-09 DIAGNOSIS — H90A32 Mixed conductive and sensorineural hearing loss, unilateral, left ear with restricted hearing on the contralateral side: Secondary | ICD-10-CM | POA: Diagnosis not present

## 2021-04-09 DIAGNOSIS — H6123 Impacted cerumen, bilateral: Secondary | ICD-10-CM | POA: Diagnosis not present

## 2021-04-11 DIAGNOSIS — J301 Allergic rhinitis due to pollen: Secondary | ICD-10-CM | POA: Diagnosis not present

## 2021-04-18 DIAGNOSIS — J301 Allergic rhinitis due to pollen: Secondary | ICD-10-CM | POA: Diagnosis not present

## 2021-05-02 DIAGNOSIS — J301 Allergic rhinitis due to pollen: Secondary | ICD-10-CM | POA: Diagnosis not present

## 2021-05-09 DIAGNOSIS — J301 Allergic rhinitis due to pollen: Secondary | ICD-10-CM | POA: Diagnosis not present

## 2021-05-17 DIAGNOSIS — J301 Allergic rhinitis due to pollen: Secondary | ICD-10-CM | POA: Diagnosis not present

## 2021-05-23 DIAGNOSIS — J301 Allergic rhinitis due to pollen: Secondary | ICD-10-CM | POA: Diagnosis not present

## 2021-06-06 DIAGNOSIS — J301 Allergic rhinitis due to pollen: Secondary | ICD-10-CM | POA: Diagnosis not present

## 2021-06-20 DIAGNOSIS — J301 Allergic rhinitis due to pollen: Secondary | ICD-10-CM | POA: Diagnosis not present

## 2021-06-27 DIAGNOSIS — J301 Allergic rhinitis due to pollen: Secondary | ICD-10-CM | POA: Diagnosis not present

## 2021-07-11 DIAGNOSIS — J301 Allergic rhinitis due to pollen: Secondary | ICD-10-CM | POA: Diagnosis not present

## 2021-07-24 DIAGNOSIS — J320 Chronic maxillary sinusitis: Secondary | ICD-10-CM | POA: Diagnosis not present

## 2021-07-24 DIAGNOSIS — H6123 Impacted cerumen, bilateral: Secondary | ICD-10-CM | POA: Diagnosis not present

## 2021-07-24 DIAGNOSIS — H7202 Central perforation of tympanic membrane, left ear: Secondary | ICD-10-CM | POA: Diagnosis not present

## 2021-07-24 DIAGNOSIS — H90A32 Mixed conductive and sensorineural hearing loss, unilateral, left ear with restricted hearing on the contralateral side: Secondary | ICD-10-CM | POA: Diagnosis not present

## 2021-07-25 DIAGNOSIS — J301 Allergic rhinitis due to pollen: Secondary | ICD-10-CM | POA: Diagnosis not present

## 2021-08-08 DIAGNOSIS — J301 Allergic rhinitis due to pollen: Secondary | ICD-10-CM | POA: Diagnosis not present

## 2021-08-16 DIAGNOSIS — J301 Allergic rhinitis due to pollen: Secondary | ICD-10-CM | POA: Diagnosis not present

## 2021-08-22 DIAGNOSIS — J301 Allergic rhinitis due to pollen: Secondary | ICD-10-CM | POA: Diagnosis not present

## 2021-08-23 DIAGNOSIS — S91201A Unspecified open wound of right great toe with damage to nail, initial encounter: Secondary | ICD-10-CM | POA: Diagnosis not present

## 2021-09-04 ENCOUNTER — Telehealth: Payer: PPO

## 2021-09-04 NOTE — Telephone Encounter (Signed)
Pt aware.

## 2021-09-04 NOTE — Telephone Encounter (Signed)
Pt calling; needs refill of progesterone/testosterone compounded cream; states [place] has sent request.  Wants to know what's going on.  [phone]

## 2021-09-04 NOTE — Telephone Encounter (Signed)
I called the pharmacy this morning and refilled it

## 2021-09-05 DIAGNOSIS — J301 Allergic rhinitis due to pollen: Secondary | ICD-10-CM | POA: Diagnosis not present

## 2021-09-19 DIAGNOSIS — J301 Allergic rhinitis due to pollen: Secondary | ICD-10-CM | POA: Diagnosis not present

## 2021-09-26 ENCOUNTER — Ambulatory Visit: Payer: PPO | Admitting: Dermatology

## 2021-09-26 ENCOUNTER — Encounter: Payer: Self-pay | Admitting: Dermatology

## 2021-09-26 ENCOUNTER — Other Ambulatory Visit: Payer: Self-pay

## 2021-09-26 DIAGNOSIS — L814 Other melanin hyperpigmentation: Secondary | ICD-10-CM

## 2021-09-26 DIAGNOSIS — Z872 Personal history of diseases of the skin and subcutaneous tissue: Secondary | ICD-10-CM | POA: Diagnosis not present

## 2021-09-26 DIAGNOSIS — L821 Other seborrheic keratosis: Secondary | ICD-10-CM

## 2021-09-26 DIAGNOSIS — Z1283 Encounter for screening for malignant neoplasm of skin: Secondary | ICD-10-CM | POA: Diagnosis not present

## 2021-09-26 DIAGNOSIS — L578 Other skin changes due to chronic exposure to nonionizing radiation: Secondary | ICD-10-CM

## 2021-09-26 DIAGNOSIS — L570 Actinic keratosis: Secondary | ICD-10-CM | POA: Diagnosis not present

## 2021-09-26 DIAGNOSIS — L820 Inflamed seborrheic keratosis: Secondary | ICD-10-CM | POA: Diagnosis not present

## 2021-09-26 DIAGNOSIS — D1800 Hemangioma unspecified site: Secondary | ICD-10-CM | POA: Diagnosis not present

## 2021-09-26 DIAGNOSIS — Z85828 Personal history of other malignant neoplasm of skin: Secondary | ICD-10-CM | POA: Diagnosis not present

## 2021-09-26 DIAGNOSIS — D229 Melanocytic nevi, unspecified: Secondary | ICD-10-CM | POA: Diagnosis not present

## 2021-09-26 MED ORDER — MUPIROCIN 2 % EX OINT: 1.0000 "application " | TOPICAL_OINTMENT | Freq: Every day | CUTANEOUS | 2 refills | Status: DC

## 2021-09-26 NOTE — Progress Notes (Signed)
Follow-Up Visit   Subjective  Hailey Wall is a 75 y.o. female who presents for the following: FBSE (Patient here for full body skin exam and skin cancer screening. Patient not aware of any new or changing spots. Patient with hx of BCC, SCC and AK's.).  The following portions of the chart were reviewed this encounter and updated as appropriate:   Tobacco  Allergies  Meds  Problems  Med Hx  Surg Hx  Fam Hx      Review of Systems:  No other skin or systemic complaints except as noted in HPI or Assessment and Plan.  Objective  Well appearing patient in no apparent distress; mood and affect are within normal limits.  A full examination was performed including scalp, head, eyes, ears, nose, lips, neck, chest, axillae, abdomen, back, buttocks, bilateral upper extremities, bilateral lower extremities, hands, feet, fingers, toes, fingernails, and toenails. All findings within normal limits unless otherwise noted below.  left temple Erythematous thin papules/macules with gritty scale.   left temple Erythematous keratotic or waxy stuck-on papule or plaque.    Assessment & Plan  AK (actinic keratosis) left temple  Prior to procedure, discussed risks of blister formation, small wound, skin dyspigmentation, or rare scar following cryotherapy. Recommend Vaseline ointment to treated areas while healing.   Actinic keratoses are precancerous spots that appear secondary to cumulative UV radiation exposure/sun exposure over time. They are chronic with expected duration over 1 year. A portion of actinic keratoses will progress to squamous cell carcinoma of the skin. It is not possible to reliably predict which spots will progress to skin cancer and so treatment is recommended to prevent development of skin cancer.  Recommend daily broad spectrum sunscreen SPF 30+ to sun-exposed areas, reapply every 2 hours as needed.  Recommend staying in the shade or wearing long sleeves, sun glasses  (UVA+UVB protection) and wide brim hats (4-inch brim around the entire circumference of the hat). Call for new or changing lesions.   Destruction of lesion - left temple  Destruction method: cryotherapy   Informed consent: discussed and consent obtained   Lesion destroyed using liquid nitrogen: Yes   Cryotherapy cycles:  2 Outcome: patient tolerated procedure well with no complications   Post-procedure details: wound care instructions given    Inflamed seborrheic keratosis left temple  Prior to procedure, discussed risks of blister formation, small wound, skin dyspigmentation, or rare scar following cryotherapy. Recommend Vaseline ointment to treated areas while healing.   Destruction of lesion - left temple  Destruction method: cryotherapy   Informed consent: discussed and consent obtained   Lesion destroyed using liquid nitrogen: Yes   Cryotherapy cycles:  2 Outcome: patient tolerated procedure well with no complications   Post-procedure details: wound care instructions given    Lentigines - Scattered tan macules - Due to sun exposure - Benign-appearing, observe - Recommend daily broad spectrum sunscreen SPF 30+ to sun-exposed areas, reapply every 2 hours as needed. - Call for any changes  Seborrheic Keratoses - Stuck-on, waxy, tan-brown papules and/or plaques  - Benign-appearing - Discussed benign etiology and prognosis. - Observe - Call for any changes  Melanocytic Nevi - Tan-brown and/or pink-flesh-colored symmetric macules and papules - Benign appearing on exam today - Observation - Call clinic for new or changing moles - Recommend daily use of broad spectrum spf 30+ sunscreen to sun-exposed areas.   Hemangiomas - Red papules - Discussed benign nature - Observe - Call for any changes  Actinic Damage - Chronic  condition, secondary to cumulative UV/sun exposure - diffuse scaly erythematous macules with underlying dyspigmentation - Recommend daily broad  spectrum sunscreen SPF 30+ to sun-exposed areas, reapply every 2 hours as needed.  - Staying in the shade or wearing long sleeves, sun glasses (UVA+UVB protection) and wide brim hats (4-inch brim around the entire circumference of the hat) are also recommended for sun protection.  - Call for new or changing lesions.  Skin cancer screening performed today.  History of Basal Cell Carcinoma of the Skin - No evidence of recurrence today at right lateral brow - Recommend regular full body skin exams - Recommend daily broad spectrum sunscreen SPF 30+ to sun-exposed areas, reapply every 2 hours as needed.  - Call if any new or changing lesions are noted between office visits  History of Squamous Cell Carcinoma of the Skin - No evidence of recurrence today at left calf - No lymphadenopathy - Recommend regular full body skin exams - Recommend daily broad spectrum sunscreen SPF 30+ to sun-exposed areas, reapply every 2 hours as needed.  - Call if any new or changing lesions are noted between office visits  History of PreCancerous Actinic Keratosis  - site(s) of PreCancerous Actinic Keratosis clear today. - these may recur and new lesions may form requiring treatment to prevent transformation into skin cancer - observe for new or changing spots and contact Between for appointment if occur - photoprotection with sun protective clothing; sunglasses and broad spectrum sunscreen with SPF of at least 30 + and frequent self skin exams recommended - yearly exams by a dermatologist recommended for persons with history of PreCancerous Actinic Keratoses  Return in about 5 months (around 02/25/2022) for TBSE.  Graciella Belton, RMA, am acting as scribe for Forest Gleason, MD .  Documentation: I have reviewed the above documentation for accuracy and completeness, and I agree with the above.  Forest Gleason, MD

## 2021-09-26 NOTE — Patient Instructions (Addendum)
Cryotherapy Aftercare  Wash gently with soap and water everyday.   Apply Vaseline and Band-Aid daily until healed.   Prior to procedure, discussed risks of blister formation, small wound, skin dyspigmentation, or rare scar following cryotherapy. Recommend Vaseline ointment to treated areas while healing.  Melanoma ABCDEs  Melanoma is the most dangerous type of skin cancer, and is the leading cause of death from skin disease.  You are more likely to develop melanoma if you: Have light-colored skin, light-colored eyes, or red or blond hair Spend a lot of time in the sun Tan regularly, either outdoors or in a tanning bed Have had blistering sunburns, especially during childhood Have a close family member who has had a melanoma Have atypical moles or large birthmarks  Early detection of melanoma is key since treatment is typically straightforward and cure rates are extremely high if we catch it early.   The first sign of melanoma is often a change in a mole or a new dark spot.  The ABCDE system is a way of remembering the signs of melanoma.  A for asymmetry:  The two halves do not match. B for border:  The edges of the growth are irregular. C for color:  A mixture of colors are present instead of an even brown color. D for diameter:  Melanomas are usually (but not always) greater than 8mm - the size of a pencil eraser. E for evolution:  The spot keeps changing in size, shape, and color.  Please check your skin once per month between visits. You can use a small mirror in front and a large mirror behind you to keep an eye on the back side or your body.   If you see any new or changing lesions before your next follow-up, please call to schedule a visit.  Please continue daily skin protection including broad spectrum sunscreen SPF 30+ to sun-exposed areas, reapplying every 2 hours as needed when you're outdoors.    If you have any questions or concerns for your doctor, please call our main  line at [phone] and press option 4 to reach your doctor's medical assistant. If no one answers, please leave a voicemail as directed and we will return your call as soon as possible. Messages left after 4 pm will be answered the following business day.   You may also send [place] a message via [place]. We typically respond to MyChart messages within 1-2 business days.  For prescription refills, please ask your pharmacy to contact our office. Our fax number is [phone].  If you have an urgent issue when the clinic is closed that cannot wait until the next business day, you can page your doctor at the number below.    Please note that while we do our best to be available for urgent issues outside of office hours, we are not available 24/7.   If you have an urgent issue and are unable to reach [place], you may choose to seek medical care at your doctor's office, retail clinic, urgent care center, or emergency room.  If you have a medical emergency, please immediately call 911 or go to the emergency department.  Pager Numbers  - Dr. Nehemiah Massed: [phone]  - Dr. Laurence Ferrari: [phone]  - Dr. Nicole Kindred: [phone]  In the event of inclement weather, please call our main line at [phone] for an update on the status of any delays or closures.  Dermatology Medication Tips: Please keep the boxes that topical medications come in in order to help  keep track of the instructions about where and how to use these. Pharmacies typically print the medication instructions only on the boxes and not directly on the medication tubes.   If your medication is too expensive, please contact our office at [phone] option 4 or send [place] a message through [place].   We are unable to tell what your co-pay for medications will be in advance as this is different depending on your insurance coverage. However, we may be able to find a substitute medication at lower cost or fill out paperwork to get insurance to cover a  needed medication.   If a prior authorization is required to get your medication covered by your insurance company, please allow [place] 1-2 business days to complete this process.  Drug prices often vary depending on where the prescription is filled and some pharmacies may offer cheaper prices.  The website www.goodrx.com contains coupons for medications through different pharmacies. The prices here do not account for what the cost may be with help from insurance (it may be cheaper with your insurance), but the website can give you the price if you did not use any insurance.  - You can print the associated coupon and take it with your prescription to the pharmacy.  - You may also stop by our office during regular business hours and pick up a GoodRx coupon card.  - If you need your prescription sent electronically to a different pharmacy, notify our office through [facility] or by phone at [phone] option 4.

## 2021-10-03 DIAGNOSIS — J301 Allergic rhinitis due to pollen: Secondary | ICD-10-CM | POA: Diagnosis not present

## 2021-10-17 DIAGNOSIS — J301 Allergic rhinitis due to pollen: Secondary | ICD-10-CM | POA: Diagnosis not present

## 2021-10-22 DIAGNOSIS — J302 Other seasonal allergic rhinitis: Secondary | ICD-10-CM | POA: Diagnosis not present

## 2021-10-22 DIAGNOSIS — H6123 Impacted cerumen, bilateral: Secondary | ICD-10-CM | POA: Diagnosis not present

## 2021-10-22 DIAGNOSIS — H90A32 Mixed conductive and sensorineural hearing loss, unilateral, left ear with restricted hearing on the contralateral side: Secondary | ICD-10-CM | POA: Diagnosis not present

## 2021-10-22 DIAGNOSIS — H7202 Central perforation of tympanic membrane, left ear: Secondary | ICD-10-CM | POA: Diagnosis not present

## 2021-10-29 DIAGNOSIS — M810 Age-related osteoporosis without current pathological fracture: Secondary | ICD-10-CM | POA: Diagnosis not present

## 2021-10-29 DIAGNOSIS — I10 Essential (primary) hypertension: Secondary | ICD-10-CM | POA: Diagnosis not present

## 2021-10-29 DIAGNOSIS — J309 Allergic rhinitis, unspecified: Secondary | ICD-10-CM | POA: Diagnosis not present

## 2021-10-29 DIAGNOSIS — Z23 Encounter for immunization: Secondary | ICD-10-CM | POA: Diagnosis not present

## 2021-10-29 DIAGNOSIS — N951 Menopausal and female climacteric states: Secondary | ICD-10-CM | POA: Diagnosis not present

## 2021-10-29 DIAGNOSIS — F419 Anxiety disorder, unspecified: Secondary | ICD-10-CM | POA: Diagnosis not present

## 2021-10-29 DIAGNOSIS — Z0000 Encounter for general adult medical examination without abnormal findings: Secondary | ICD-10-CM | POA: Diagnosis not present

## 2021-10-29 DIAGNOSIS — F4310 Post-traumatic stress disorder, unspecified: Secondary | ICD-10-CM | POA: Diagnosis not present

## 2021-10-29 DIAGNOSIS — E785 Hyperlipidemia, unspecified: Secondary | ICD-10-CM | POA: Diagnosis not present

## 2021-10-29 DIAGNOSIS — E673 Hypervitaminosis D: Secondary | ICD-10-CM | POA: Diagnosis not present

## 2021-11-07 DIAGNOSIS — J301 Allergic rhinitis due to pollen: Secondary | ICD-10-CM | POA: Diagnosis not present

## 2021-11-28 DIAGNOSIS — J301 Allergic rhinitis due to pollen: Secondary | ICD-10-CM | POA: Diagnosis not present

## 2021-12-05 DIAGNOSIS — J301 Allergic rhinitis due to pollen: Secondary | ICD-10-CM | POA: Diagnosis not present

## 2021-12-19 DIAGNOSIS — J301 Allergic rhinitis due to pollen: Secondary | ICD-10-CM | POA: Diagnosis not present

## 2022-01-02 DIAGNOSIS — J301 Allergic rhinitis due to pollen: Secondary | ICD-10-CM | POA: Diagnosis not present

## 2022-01-09 DIAGNOSIS — J301 Allergic rhinitis due to pollen: Secondary | ICD-10-CM | POA: Diagnosis not present

## 2022-01-16 DIAGNOSIS — J301 Allergic rhinitis due to pollen: Secondary | ICD-10-CM | POA: Diagnosis not present

## 2022-01-19 DIAGNOSIS — S40861A Insect bite (nonvenomous) of right upper arm, initial encounter: Secondary | ICD-10-CM | POA: Diagnosis not present

## 2022-01-19 DIAGNOSIS — L03113 Cellulitis of right upper limb: Secondary | ICD-10-CM | POA: Diagnosis not present

## 2022-01-19 DIAGNOSIS — W57XXXA Bitten or stung by nonvenomous insect and other nonvenomous arthropods, initial encounter: Secondary | ICD-10-CM | POA: Diagnosis not present

## 2022-01-20 ENCOUNTER — Encounter: Payer: Self-pay | Admitting: Nurse Practitioner

## 2022-01-23 DIAGNOSIS — J301 Allergic rhinitis due to pollen: Secondary | ICD-10-CM | POA: Diagnosis not present

## 2022-01-28 DIAGNOSIS — H90A32 Mixed conductive and sensorineural hearing loss, unilateral, left ear with restricted hearing on the contralateral side: Secondary | ICD-10-CM | POA: Diagnosis not present

## 2022-01-28 DIAGNOSIS — H7202 Central perforation of tympanic membrane, left ear: Secondary | ICD-10-CM | POA: Diagnosis not present

## 2022-01-28 DIAGNOSIS — H6123 Impacted cerumen, bilateral: Secondary | ICD-10-CM | POA: Diagnosis not present

## 2022-01-28 DIAGNOSIS — J320 Chronic maxillary sinusitis: Secondary | ICD-10-CM | POA: Diagnosis not present

## 2022-02-06 DIAGNOSIS — J301 Allergic rhinitis due to pollen: Secondary | ICD-10-CM | POA: Diagnosis not present

## 2022-02-13 DIAGNOSIS — J301 Allergic rhinitis due to pollen: Secondary | ICD-10-CM | POA: Diagnosis not present

## 2022-02-20 ENCOUNTER — Other Ambulatory Visit: Payer: Self-pay

## 2022-02-20 ENCOUNTER — Ambulatory Visit: Payer: PPO | Admitting: Dermatology

## 2022-02-20 DIAGNOSIS — Z1283 Encounter for screening for malignant neoplasm of skin: Secondary | ICD-10-CM

## 2022-02-20 DIAGNOSIS — D229 Melanocytic nevi, unspecified: Secondary | ICD-10-CM | POA: Diagnosis not present

## 2022-02-20 DIAGNOSIS — L578 Other skin changes due to chronic exposure to nonionizing radiation: Secondary | ICD-10-CM

## 2022-02-20 DIAGNOSIS — J301 Allergic rhinitis due to pollen: Secondary | ICD-10-CM | POA: Diagnosis not present

## 2022-02-20 DIAGNOSIS — L814 Other melanin hyperpigmentation: Secondary | ICD-10-CM | POA: Diagnosis not present

## 2022-02-20 DIAGNOSIS — L738 Other specified follicular disorders: Secondary | ICD-10-CM

## 2022-02-20 DIAGNOSIS — L309 Dermatitis, unspecified: Secondary | ICD-10-CM | POA: Diagnosis not present

## 2022-02-20 DIAGNOSIS — L821 Other seborrheic keratosis: Secondary | ICD-10-CM

## 2022-02-20 DIAGNOSIS — L853 Xerosis cutis: Secondary | ICD-10-CM | POA: Diagnosis not present

## 2022-02-20 DIAGNOSIS — T1490XD Injury, unspecified, subsequent encounter: Secondary | ICD-10-CM

## 2022-02-20 DIAGNOSIS — Z85828 Personal history of other malignant neoplasm of skin: Secondary | ICD-10-CM | POA: Diagnosis not present

## 2022-02-20 DIAGNOSIS — L720 Epidermal cyst: Secondary | ICD-10-CM

## 2022-02-20 DIAGNOSIS — S81001A Unspecified open wound, right knee, initial encounter: Secondary | ICD-10-CM

## 2022-02-20 DIAGNOSIS — D1800 Hemangioma unspecified site: Secondary | ICD-10-CM | POA: Diagnosis not present

## 2022-02-20 MED ORDER — TRIAMCINOLONE ACETONIDE 0.1 % EX OINT: TOPICAL_OINTMENT | CUTANEOUS | 0 refills | Status: DC

## 2022-02-20 NOTE — Patient Instructions (Addendum)
Gentle Skin Care Guide  1. Bathe no more than once a day.  2. Avoid bathing in hot water  3. Use a mild soap like Dove, Vanicream, Cetaphil, CeraVe. Can use Lever 2000 or Cetaphil antibacterial soap  4. Use soap only where you need it. On most days, use it under your arms, between your legs, and on your feet. Let the water rinse other areas unless visibly dirty.  5. When you get out of the bath/shower, use a towel to gently blot your skin dry, don't rub it.  6. While your skin is still a little damp, apply a moisturizing cream such as Vanicream, CeraVe, Cetaphil, Eucerin, Sarna lotion or plain Vaseline Jelly. For hands apply Neutrogena [place] Hand Cream or Excipial Hand Cream.  7. Reapply moisturizer any time you start to itch or feel dry.  8. Sometimes using free and clear laundry detergents can be helpful. Fabric softener sheets should be avoided. Downy Free & Gentle liquid, or any liquid fabric softener that is free of dyes and perfumes, it acceptable to use  9. If your doctor has given you prescription creams you may apply moisturizers over them    Recommend taking Heliocare sun protection supplement daily in sunny weather for additional sun protection. For maximum protection on the sunniest days, you can take up to 2 capsules of regular Heliocare OR take 1 capsule of Heliocare Ultra. For prolonged exposure (such as a full day in the sun), you can repeat your dose of the supplement 4 hours after your first dose. Heliocare can be purchased at [employer], at some [facility] or at [email or web address].     If You Need Anything After Your Visit  If you have any questions or concerns for your doctor, please call our main line at [phone] and press option 4 to reach your doctor's medical assistant. If no one answers, please leave a voicemail as directed and we will return your call as soon as possible. Messages left after 4 pm will be answered the following business day.   You  may also send [place] a message via [place]. We typically respond to MyChart messages within 1-2 business days.  For prescription refills, please ask your pharmacy to contact our office. Our fax number is [phone].  If you have an urgent issue when the clinic is closed that cannot wait until the next business day, you can page your doctor at the number below.    Please note that while we do our best to be available for urgent issues outside of office hours, we are not available 24/7.   If you have an urgent issue and are unable to reach [place], you may choose to seek medical care at your doctor's office, retail clinic, urgent care center, or emergency room.  If you have a medical emergency, please immediately call 911 or go to the emergency department.  Pager Numbers  - Dr. Nehemiah Massed: [phone]  - Dr. Laurence Ferrari: [phone]  - Dr. Nicole Kindred: [phone]  In the event of inclement weather, please call our main line at [phone] for an update on the status of any delays or closures.  Dermatology Medication Tips: Please keep the boxes that topical medications come in in order to help keep track of the instructions about where and how to use these. Pharmacies typically print the medication instructions only on the boxes and not directly on the medication tubes.   If your medication is too expensive, please contact our office at [phone] option 4 or send  [place] a message through [place].   We are unable to tell what your co-pay for medications will be in advance as this is different depending on your insurance coverage. However, we may be able to find a substitute medication at lower cost or fill out paperwork to get insurance to cover a needed medication.   If a prior authorization is required to get your medication covered by your insurance company, please allow [place] 1-2 business days to complete this process.  Drug prices often vary depending on where the prescription is filled and some  pharmacies may offer cheaper prices.  The website www.goodrx.com contains coupons for medications through different pharmacies. The prices here do not account for what the cost may be with help from insurance (it may be cheaper with your insurance), but the website can give you the price if you did not use any insurance.  - You can print the associated coupon and take it with your prescription to the pharmacy.  - You may also stop by our office during regular business hours and pick up a GoodRx coupon card.  - If you need your prescription sent electronically to a different pharmacy, notify our office through [facility] or by phone at [phone] option 4.     Si Usted Necesita Algo Despus de Su Visita  Tambin puede enviarnos un mensaje a travs de Pharmacist, community. Por lo general respondemos a los mensajes de MyChart en el transcurso de 1 a 2 das hbiles.  Para renovar recetas, por favor pida a su farmacia que se ponga en contacto con nuestra oficina. Harland Dingwall de fax es [place] [phone].  Si tiene un asunto urgente cuando la clnica est cerrada y que no puede esperar hasta el siguiente da hbil, puede llamar/localizar a su doctor(a) al nmero que aparece a continuacin.   Por favor, tenga en cuenta que aunque hacemos todo lo posible para estar disponibles para asuntos urgentes fuera del horario de [place], no estamos disponibles las 24 horas del da, los 7 das de la [place].   Si tiene un problema urgente y no puede comunicarse con nosotros, puede optar por buscar atencin mdica  en el consultorio de su doctor(a), en una clnica privada, en un centro de atencin urgente o en una sala de emergencias.  Si tiene Engineering geologist, por favor llame inmediatamente al 911 o vaya a la sala de emergencias.  Nmeros de bper  - Dr. Nehemiah Massed: [phone]  - Dra. Moye: [phone]  - Dra. Nicole Kindred: [phone]  En caso de inclemencias del [place], por favor llame a Johnsie Kindred  principal al [phone] para una actualizacin sobre el [place] de cualquier retraso o cierre.  Consejos para la medicacin en dermatologa: Por favor, guarde las cajas en las que vienen los medicamentos de uso tpico para ayudarle a seguir las instrucciones sobre dnde y cmo usarlos. Las farmacias generalmente imprimen las instrucciones del medicamento slo en las cajas y no directamente en los tubos del [place].   Si su medicamento es muy caro, por favor, pngase en contacto con Zigmund Daniel llamando al [phone] y presione la opcin 4 o envenos un mensaje a travs de Pharmacist, community.   No podemos decirle cul ser su copago por los medicamentos por adelantado ya que esto es diferente dependiendo de la cobertura de su seguro. Sin embargo, es posible que podamos encontrar un medicamento sustituto a Electrical engineer un formulario para que el seguro cubra el medicamento que se considera necesario.   Si se  requiere una autorizacin previa para que su compaa de seguros [place] su medicamento, por favor permtanos de 1 a 2 das hbiles para completar este proceso.  Los precios de los medicamentos varan con frecuencia dependiendo del Environmental consultant de dnde se surte la receta y alguna farmacias pueden ofrecer precios ms baratos.  El sitio web www.goodrx.com tiene cupones para medicamentos de Airline pilot. Los precios aqu no tienen en cuenta lo que podra costar con la ayuda del seguro (puede ser ms barato con su seguro), pero el sitio web puede darle el precio si no utiliz Research scientist (physical sciences).  - Puede imprimir el cupn correspondiente y llevarlo con su receta a la farmacia.  - Tambin puede pasar por nuestra oficina durante el horario de atencin regular y Charity fundraiser una tarjeta de cupones de GoodRx.  - Si necesita que su receta se enve electrnicamente a una farmacia diferente, informe a nuestra oficina a travs de MyChart de [place] o por telfono llamando al [phone] y presione la opcin  4.

## 2022-02-20 NOTE — Progress Notes (Signed)
Follow-Up Visit   Subjective  Hailey Wall is a 76 y.o. female who presents for the following: Annual Exam (Hx BCC, SCC, AK's ). The patient presents for Total-Body Skin Exam (TBSE) for skin cancer screening and mole check.  The patient has spots, moles and lesions to be evaluated, some may be new or changing.  The following portions of the chart were reviewed this encounter and updated as appropriate:   Tobacco   Allergies   Meds   Problems   Med Hx   Surg Hx   Fam Hx       Review of Systems:  No other skin or systemic complaints except as noted in HPI or Assessment and Plan.  Objective  Well appearing patient in no apparent distress; mood and affect are within normal limits.  A full examination was performed including scalp, head, eyes, ears, nose, lips, neck, chest, axillae, abdomen, back, buttocks, bilateral upper extremities, bilateral lower extremities, hands, feet, fingers, toes, fingernails, and toenails. All findings within normal limits unless otherwise noted below.  R ant knee Healing laceration.  L shoulder Scaly erythematous papules and patches +/- dyspigmentation, lichenification, excoriations.     Assessment & Plan  Healing wound R ant knee  Due to trauma from falling -   Continue wound care daily. Wash with soap and water, apply Mupirocin 2% ointment to aa's QD and cover with bandage.   Eczema, unspecified type L shoulder  Chronic and persistent condition with duration or expected duration over one year. Condition is bothersome/symptomatic for patient. Currently flared.  Eczema is a chronic, relapsing, pruritic condition that can significantly affect quality of life. It is often associated with allergic rhinitis and/or asthma and can require treatment with topical medications, phototherapy, or in severe cases biologic injectable medication (Dupixent; Adbry) or Oral JAK inhibitors.  Recommend mild soap and moisturizing cream 1-2 times daily.  Gentle skin  care handout provided.   Start TMC 0.1% ointment to aa's QD-BID PRN x 2 weeks. Topical steroids (such as triamcinolone, fluocinolone, fluocinonide, mometasone, clobetasol, halobetasol, betamethasone, hydrocortisone) can cause thinning and lightening of the skin if they are used for too long in the same area. Your physician has selected the right strength medicine for your problem and area affected on the body. Please use your medication only as directed by your physician to prevent side effects.    triamcinolone ointment (KENALOG) 0.1 % - L shoulder Apply to aa's eczema QD-BID up to two weeks PRN. Avoid applying to face, groin, and axilla. Use as directed. Long-term use can cause thinning of the skin.   Lentigines - Scattered tan macules - Due to sun exposure - Benign-appearing, observe - Recommend daily broad spectrum sunscreen SPF 30+ to sun-exposed areas, reapply every 2 hours as needed. - Call for any changes  Seborrheic Keratoses - Stuck-on, waxy, tan-brown papules and/or plaques  - Benign-appearing - Discussed benign etiology and prognosis. - Observe - Call for any changes  Melanocytic Nevi - Tan-brown and/or pink-flesh-colored symmetric macules and papules - Benign appearing on exam today - Observation - Call clinic for new or changing moles - Recommend daily use of broad spectrum spf 30+ sunscreen to sun-exposed areas.   Hemangiomas - Red papules - Discussed benign nature - Observe - Call for any changes  Actinic Damage - Chronic condition, secondary to cumulative UV/sun exposure - diffuse scaly erythematous macules with underlying dyspigmentation - Recommend daily broad spectrum sunscreen SPF 30+ to sun-exposed areas, reapply every 2 hours as needed.  -  Staying in the shade or wearing long sleeves, sun glasses (UVA+UVB protection) and wide brim hats (4-inch brim around the entire circumference of the hat) are also recommended for sun protection.  - Call for new or  changing lesions.  History of Basal Cell Carcinoma of the Skin - No evidence of recurrence today - Recommend regular full body skin exams - Recommend daily broad spectrum sunscreen SPF 30+ to sun-exposed areas, reapply every 2 hours as needed.  - Call if any new or changing lesions are noted between office visits  History of Squamous Cell Carcinoma of the Skin - No evidence of recurrence today - No lymphadenopathy - Recommend regular full body skin exams - Recommend daily broad spectrum sunscreen SPF 30+ to sun-exposed areas, reapply every 2 hours as needed.  - Call if any new or changing lesions are noted between office visits  Milia - tiny firm white papules - type of cyst - benign - may be extracted if symptomatic - observe  Sebaceous Hyperplasia - Small yellow papules with a central dell - Benign - Observe  Xerosis - diffuse xerotic patches - recommend gentle, hydrating skin care - gentle skin care handout given  Skin cancer screening performed today.  Return in about 1 year (around 02/20/2023) for TBSE.  Luther Redo, CMA, am acting as scribe for Forest Gleason, MD .   Documentation: I have reviewed the above documentation for accuracy and completeness, and I agree with the above.  Forest Gleason, MD

## 2022-02-24 ENCOUNTER — Encounter: Payer: Self-pay | Admitting: Dermatology

## 2022-02-26 ENCOUNTER — Ambulatory Visit: Payer: PPO | Admitting: Obstetrics & Gynecology

## 2022-03-03 ENCOUNTER — Ambulatory Visit: Payer: PPO | Admitting: Obstetrics & Gynecology

## 2022-03-06 DIAGNOSIS — J301 Allergic rhinitis due to pollen: Secondary | ICD-10-CM | POA: Diagnosis not present

## 2022-03-13 DIAGNOSIS — J301 Allergic rhinitis due to pollen: Secondary | ICD-10-CM | POA: Diagnosis not present

## 2022-03-17 ENCOUNTER — Encounter: Payer: Self-pay | Admitting: Nurse Practitioner

## 2022-03-17 ENCOUNTER — Ambulatory Visit (INDEPENDENT_AMBULATORY_CARE_PROVIDER_SITE_OTHER): Payer: PPO | Admitting: Nurse Practitioner

## 2022-03-17 VITALS — BP 140/86 | HR 84 | Temp 97.8°F | Resp 16 | Ht 66.0 in | Wt 163.5 lb

## 2022-03-17 DIAGNOSIS — J3089 Other allergic rhinitis: Secondary | ICD-10-CM

## 2022-03-17 DIAGNOSIS — M542 Cervicalgia: Secondary | ICD-10-CM

## 2022-03-17 DIAGNOSIS — K219 Gastro-esophageal reflux disease without esophagitis: Secondary | ICD-10-CM

## 2022-03-17 DIAGNOSIS — F4312 Post-traumatic stress disorder, chronic: Secondary | ICD-10-CM

## 2022-03-17 DIAGNOSIS — N8501 Benign endometrial hyperplasia: Secondary | ICD-10-CM | POA: Diagnosis not present

## 2022-03-17 DIAGNOSIS — Z7689 Persons encountering health services in other specified circumstances: Secondary | ICD-10-CM

## 2022-03-17 DIAGNOSIS — I10 Essential (primary) hypertension: Secondary | ICD-10-CM | POA: Diagnosis not present

## 2022-03-17 DIAGNOSIS — M810 Age-related osteoporosis without current pathological fracture: Secondary | ICD-10-CM

## 2022-03-17 DIAGNOSIS — F419 Anxiety disorder, unspecified: Secondary | ICD-10-CM

## 2022-03-17 MED ORDER — BUPROPION HCL ER (XL) 150 MG PO TB24: 150.0000 mg | ORAL_TABLET | Freq: Every morning | ORAL | 1 refills | Status: AC
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
## 2022-03-26 ENCOUNTER — Ambulatory Visit (INDEPENDENT_AMBULATORY_CARE_PROVIDER_SITE_OTHER): Payer: PPO | Admitting: Obstetrics & Gynecology

## 2022-03-26 ENCOUNTER — Encounter: Payer: Self-pay | Admitting: Obstetrics & Gynecology

## 2022-03-26 VITALS — BP 120/70 | Ht 66.0 in | Wt 162.0 lb

## 2022-03-26 DIAGNOSIS — N8111 Cystocele, midline: Secondary | ICD-10-CM | POA: Diagnosis not present

## 2022-03-26 DIAGNOSIS — N952 Postmenopausal atrophic vaginitis: Secondary | ICD-10-CM

## 2022-03-26 DIAGNOSIS — Z1231 Encounter for screening mammogram for malignant neoplasm of breast: Secondary | ICD-10-CM

## 2022-03-26 DIAGNOSIS — M810 Age-related osteoporosis without current pathological fracture: Secondary | ICD-10-CM

## 2022-03-26 MED ORDER — PROGESTERONE COMPOUNDING KIT 20 % TD CREA
TOPICAL_CREAM | TRANSDERMAL | 12 refills | Status: AC
Start: 2022-03-26 — End: ?

## 2022-03-26 MED ORDER — ESTRIOL 10 % CREA: TOPICAL_CREAM | 12 refills | Status: AC

## 2022-03-26 NOTE — Patient Instructions (Signed)
This is the new practice information you requested: ? ?Dr. [facility] ?[address] ?[address] ?[place], [place]  [address] ?[phone] ? ?[facility] ?   Dr Evalyn Casco ?   Or Dr Prentice Docker ? ? ?Mammogram every year ?   Call [phone] to schedule at [facility] ?Colonoscopy every 10 years ?Labs yearly (with PCP) ?
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
## 2022-03-27 ENCOUNTER — Telehealth: Payer: Self-pay | Admitting: Obstetrics & Gynecology

## 2022-03-27 ENCOUNTER — Telehealth: Payer: Self-pay

## 2022-03-27 DIAGNOSIS — J301 Allergic rhinitis due to pollen: Secondary | ICD-10-CM | POA: Diagnosis not present

## 2022-03-27 NOTE — Telephone Encounter (Signed)
Betsy from [employer] calling; they recv'd an eRx for a combination cream that has estrogen and testosterone in it; eRx wasn't signed electronically; they need confirmation the rx is correct since there is a controlled substance in it; a verbal order will do.  [phone]  Spoke c Newton Hamilton and adv her that rx is in pt's chart by [facility]; my name given as well. ?

## 2022-03-27 NOTE — Telephone Encounter (Signed)
Pt called and would like to speak with you about whether you are going to contract with [employer] at your new practice.  She would like to continue being your pt, but she has [employer].  Will you be contracting with that insurance company?   ?

## 2022-03-28 ENCOUNTER — Encounter: Payer: Self-pay | Admitting: Nurse Practitioner

## 2022-03-31 NOTE — Telephone Encounter (Signed)
She has called the office and they accept that insurance.  She was wanting to make sure that you were specifically going to contract with them.  So I am guessing that if they take the insurance you will also contract with them?? ?

## 2022-03-31 NOTE — Telephone Encounter (Signed)
I do not know at this moment, but [facility] probably does.  Surely there is a way she can check herself with her insurance or even calling the [facility] based office  (I gave her the info on that she would need).

## 2022-04-03 DIAGNOSIS — J301 Allergic rhinitis due to pollen: Secondary | ICD-10-CM | POA: Diagnosis not present

## 2022-04-09 ENCOUNTER — Ambulatory Visit (INDEPENDENT_AMBULATORY_CARE_PROVIDER_SITE_OTHER): Payer: PPO

## 2022-04-09 ENCOUNTER — Ambulatory Visit: Payer: PPO | Admitting: Podiatry

## 2022-04-09 ENCOUNTER — Encounter: Payer: Self-pay | Admitting: Podiatry

## 2022-04-09 DIAGNOSIS — M722 Plantar fascial fibromatosis: Secondary | ICD-10-CM

## 2022-04-09 DIAGNOSIS — M2022 Hallux rigidus, left foot: Secondary | ICD-10-CM

## 2022-04-09 DIAGNOSIS — Z472 Encounter for removal of internal fixation device: Secondary | ICD-10-CM

## 2022-04-10 NOTE — Progress Notes (Signed)
Subjective:  ? ?Patient ID: Hailey Wall, female   DOB: 76 y.o.   MRN: [id number]  ? ?HPI ?Patient presents concerned about a bump on top of the left foot and had surgery years ago and states recently she does get some mild discomfort in the joint of the big toe if she does too much activity.  Also has history of inflammation of the plantar fascia right and does wear a brace when it gets flared up and patient does not smoke likes to be active ? ? ?Review of Systems  ?All other systems reviewed and are negative. ? ? ?   ?Objective:  ?Physical Exam ?Vitals and nursing note reviewed.  ?Constitutional:   ?   Appearance: She is well-developed.  ?Pulmonary:  ?   Effort: Pulmonary effort is normal.  ?Musculoskeletal:     ?   General: Normal range of motion.  ?Skin: ?   General: Skin is warm.  ?Neurological:  ?   Mental Status: She is alert.  ?  ?Neurovascular status found to be intact muscle strength is found to be adequate range of motion is noted to be adequate.  Range of motion of the first MPJ is adequate there is previous incisions on the first metatarsal of both feet and there is a small prominent bump on the shaft of the metatarsal most likely due to previous pin insertion.  Right foot has mild fascial symptoms under control with bracing and shoe gear modifications ? ?   ?Assessment:  ?Probability for a pin that might be slightly prominent left with possibility for mild inflammation of the joint along with Planter fasciitis chronic right ? ?   ?Plan:  ?H&P reviewed both conditions and x-rays.  At this point I went ahead and I discussed with her pins I could remove the pin and explain what we would do but at this point do not recommend this and since it does not hurt she will continue to do what she does and just be careful with shoe gear and activities.  Patient will be seen back as needed ? ?X-rays indicate there is 2 pins in the left first metatarsal from previous surgery joint looks good congruence no  indications of arthritis ?   ? ? ?

## 2022-04-15 DIAGNOSIS — J301 Allergic rhinitis due to pollen: Secondary | ICD-10-CM | POA: Diagnosis not present

## 2022-04-16 DIAGNOSIS — N3001 Acute cystitis with hematuria: Secondary | ICD-10-CM | POA: Diagnosis not present

## 2022-04-17 DIAGNOSIS — J301 Allergic rhinitis due to pollen: Secondary | ICD-10-CM | POA: Diagnosis not present

## 2022-04-23 ENCOUNTER — Telehealth: Payer: Self-pay

## 2022-04-23 NOTE — Telephone Encounter (Signed)
Pt calling; found out her ins is contracted where PH is going. Will PH be contracted c [employer]? If so, can she keep him as her doctor?  [phone]  Adv pt I owed her a huge apology for not getting back with her sooner (she left msg in March. I was at our [facility] office that day and had to leave before I could get back to her (d/t no OT policy).  I stuck msg in my pocket book (information wasn't seen by anyone) and found it yesterday.  Adv pt she could follow [place]; she would need to call his new office to see if he will still be taking her ins at the new office.  Address and phone number to new office given.  Pt appreciative and understanding. ?

## 2022-04-24 DIAGNOSIS — H5213 Myopia, bilateral: Secondary | ICD-10-CM | POA: Diagnosis not present

## 2022-04-24 DIAGNOSIS — H25013 Cortical age-related cataract, bilateral: Secondary | ICD-10-CM | POA: Diagnosis not present

## 2022-04-25 DIAGNOSIS — R0602 Shortness of breath: Secondary | ICD-10-CM | POA: Diagnosis not present

## 2022-04-25 DIAGNOSIS — H7202 Central perforation of tympanic membrane, left ear: Secondary | ICD-10-CM | POA: Diagnosis not present

## 2022-04-25 DIAGNOSIS — H6061 Unspecified chronic otitis externa, right ear: Secondary | ICD-10-CM | POA: Diagnosis not present

## 2022-04-25 DIAGNOSIS — H6123 Impacted cerumen, bilateral: Secondary | ICD-10-CM | POA: Diagnosis not present

## 2022-05-01 DIAGNOSIS — J301 Allergic rhinitis due to pollen: Secondary | ICD-10-CM | POA: Diagnosis not present

## 2022-05-02 ENCOUNTER — Ambulatory Visit
Admission: RE | Admit: 2022-05-02 | Discharge: 2022-05-02 | Disposition: A | Discharge status: Home / Self Care | Payer: PPO | Source: Ambulatory Visit | Attending: Obstetrics & Gynecology | Admitting: Obstetrics & Gynecology

## 2022-05-02 DIAGNOSIS — Z1231 Encounter for screening mammogram for malignant neoplasm of breast: Secondary | ICD-10-CM | POA: Diagnosis not present

## 2022-05-08 DIAGNOSIS — J301 Allergic rhinitis due to pollen: Secondary | ICD-10-CM | POA: Diagnosis not present

## 2022-05-15 DIAGNOSIS — J301 Allergic rhinitis due to pollen: Secondary | ICD-10-CM | POA: Diagnosis not present

## 2022-05-20 DIAGNOSIS — Z79890 Hormone replacement therapy (postmenopausal): Secondary | ICD-10-CM | POA: Diagnosis not present

## 2022-05-21 ENCOUNTER — Telehealth: Payer: Self-pay | Admitting: Nurse Practitioner

## 2022-05-21 NOTE — Telephone Encounter (Signed)
Spoke patient.  She stated she had her AWV completed last October or November with another provider.  I could not find in her chart but she said it was in her records.

## 2022-05-22 DIAGNOSIS — J301 Allergic rhinitis due to pollen: Secondary | ICD-10-CM | POA: Diagnosis not present

## 2022-06-05 DIAGNOSIS — J301 Allergic rhinitis due to pollen: Secondary | ICD-10-CM | POA: Diagnosis not present

## 2022-06-12 DIAGNOSIS — J301 Allergic rhinitis due to pollen: Secondary | ICD-10-CM | POA: Diagnosis not present

## 2022-06-19 DIAGNOSIS — J301 Allergic rhinitis due to pollen: Secondary | ICD-10-CM | POA: Diagnosis not present

## 2022-06-26 DIAGNOSIS — J301 Allergic rhinitis due to pollen: Secondary | ICD-10-CM | POA: Diagnosis not present

## 2022-07-03 DIAGNOSIS — J301 Allergic rhinitis due to pollen: Secondary | ICD-10-CM | POA: Diagnosis not present

## 2022-07-07 DIAGNOSIS — J301 Allergic rhinitis due to pollen: Secondary | ICD-10-CM | POA: Diagnosis not present

## 2022-07-10 DIAGNOSIS — J301 Allergic rhinitis due to pollen: Secondary | ICD-10-CM | POA: Diagnosis not present

## 2022-07-17 DIAGNOSIS — J301 Allergic rhinitis due to pollen: Secondary | ICD-10-CM | POA: Diagnosis not present

## 2022-07-18 DIAGNOSIS — R059 Cough, unspecified: Secondary | ICD-10-CM | POA: Diagnosis not present

## 2022-07-18 DIAGNOSIS — H6123 Impacted cerumen, bilateral: Secondary | ICD-10-CM | POA: Diagnosis not present

## 2022-07-18 DIAGNOSIS — H7202 Central perforation of tympanic membrane, left ear: Secondary | ICD-10-CM | POA: Diagnosis not present

## 2022-07-18 DIAGNOSIS — J320 Chronic maxillary sinusitis: Secondary | ICD-10-CM | POA: Diagnosis not present

## 2022-07-18 DIAGNOSIS — J301 Allergic rhinitis due to pollen: Secondary | ICD-10-CM | POA: Diagnosis not present

## 2022-07-24 DIAGNOSIS — J301 Allergic rhinitis due to pollen: Secondary | ICD-10-CM | POA: Diagnosis not present

## 2022-08-07 DIAGNOSIS — J301 Allergic rhinitis due to pollen: Secondary | ICD-10-CM | POA: Diagnosis not present

## 2022-08-14 DIAGNOSIS — F411 Generalized anxiety disorder: Secondary | ICD-10-CM | POA: Diagnosis not present

## 2022-08-14 DIAGNOSIS — F4310 Post-traumatic stress disorder, unspecified: Secondary | ICD-10-CM | POA: Diagnosis not present

## 2022-08-14 DIAGNOSIS — E663 Overweight: Secondary | ICD-10-CM | POA: Diagnosis not present

## 2022-08-21 DIAGNOSIS — J301 Allergic rhinitis due to pollen: Secondary | ICD-10-CM | POA: Diagnosis not present

## 2022-08-25 DIAGNOSIS — D519 Vitamin B12 deficiency anemia, unspecified: Secondary | ICD-10-CM | POA: Diagnosis not present

## 2022-08-25 DIAGNOSIS — E039 Hypothyroidism, unspecified: Secondary | ICD-10-CM | POA: Diagnosis not present

## 2022-08-25 DIAGNOSIS — E7800 Pure hypercholesterolemia, unspecified: Secondary | ICD-10-CM | POA: Diagnosis not present

## 2022-08-25 DIAGNOSIS — E119 Type 2 diabetes mellitus without complications: Secondary | ICD-10-CM | POA: Diagnosis not present

## 2022-08-25 DIAGNOSIS — E559 Vitamin D deficiency, unspecified: Secondary | ICD-10-CM | POA: Diagnosis not present

## 2022-08-27 ENCOUNTER — Ambulatory Visit: Payer: PPO | Admitting: Dermatology

## 2022-08-27 DIAGNOSIS — D229 Melanocytic nevi, unspecified: Secondary | ICD-10-CM

## 2022-08-27 DIAGNOSIS — Z85828 Personal history of other malignant neoplasm of skin: Secondary | ICD-10-CM

## 2022-08-27 DIAGNOSIS — Z1283 Encounter for screening for malignant neoplasm of skin: Secondary | ICD-10-CM

## 2022-08-27 DIAGNOSIS — L821 Other seborrheic keratosis: Secondary | ICD-10-CM | POA: Diagnosis not present

## 2022-08-27 DIAGNOSIS — L219 Seborrheic dermatitis, unspecified: Secondary | ICD-10-CM | POA: Diagnosis not present

## 2022-08-27 DIAGNOSIS — D1800 Hemangioma unspecified site: Secondary | ICD-10-CM | POA: Diagnosis not present

## 2022-08-27 DIAGNOSIS — L814 Other melanin hyperpigmentation: Secondary | ICD-10-CM

## 2022-08-27 DIAGNOSIS — L578 Other skin changes due to chronic exposure to nonionizing radiation: Secondary | ICD-10-CM | POA: Diagnosis not present

## 2022-08-27 MED ORDER — TRIAMCINOLONE ACETONIDE 0.1 % EX CREA: TOPICAL_CREAM | CUTANEOUS | 0 refills | Status: DC

## 2022-08-27 NOTE — Progress Notes (Signed)
Follow-Up Visit   Subjective  Hailey Wall is a 76 y.o. female who presents for the following: Annual Exam (6 month tbse. Hx of aks, hx of scc, ).  The patient presents for Total-Body Skin Exam (TBSE) for skin cancer screening and mole check.  The patient has spots, moles and lesions to be evaluated, some may be new or changing and the patient has concerns that these could be cancer.   The following portions of the chart were reviewed this encounter and updated as appropriate:  Tobacco  Allergies  Meds  Problems  Med Hx  Surg Hx  Fam Hx      Review of Systems: No other skin or systemic complaints except as noted in HPI or Assessment and Plan.   Objective  Well appearing patient in no apparent distress; mood and affect are within normal limits.  A full examination was performed including scalp, head, eyes, ears, nose, lips, neck, chest, axillae, abdomen, back, buttocks, bilateral upper extremities, bilateral lower extremities, hands, feet, fingers, toes, fingernails, and toenails. All findings within normal limits unless otherwise noted below.  b/l ears and face Scaly pink thin plaques   Assessment & Plan  Seborrheic dermatitis b/l ears and face  Chronic and persistent condition with duration or expected duration over one year. Condition is bothersome/symptomatic for patient. Currently flared.  Seborrheic Dermatitis  -  is a chronic persistent rash characterized by pinkness and scaling most commonly of the mid face but also can occur on the scalp (dandruff), ears; mid chest, mid back and groin.  It tends to be exacerbated by stress and cooler weather.  People who have neurologic disease may experience new onset or exacerbation of existing seborrheic dermatitis.  The condition is not curable but treatable and can be controlled.  Triamcinolone cream apply bid prn aa ears for itch. Can use for up to 2 weeks at time. Avoid applying to face, groin, and axilla. Use as directed.    Topical steroids (such as triamcinolone, fluocinolone, fluocinonide, mometasone, clobetasol, halobetasol, betamethasone, hydrocortisone) can cause thinning and lightening of the skin if they are used for too long in the same area. Your physician has selected the right strength medicine for your problem and area affected on the body. Please use your medication only as directed by your physician to prevent side effects.     triamcinolone cream (KENALOG) 0.1 % - b/l ears and face Apply topically to itch areas of ears twice daily as needed. Can use for up to 2 weeks. Avoid applying to face, groin, and axilla. Use as directed.  Lentigines - Scattered tan macules - Due to sun exposure - Benign-appearing, observe - Recommend daily broad spectrum sunscreen SPF 30+ to sun-exposed areas, reapply every 2 hours as needed. - Call for any changes  Seborrheic Keratoses - Stuck-on, waxy, tan-brown papules and/or plaques  - Benign-appearing - Discussed benign etiology and prognosis. - Observe - Call for any changes  Melanocytic Nevi - Tan-brown and/or pink-flesh-colored symmetric macules and papules - Benign appearing on exam today - Observation - Call clinic for new or changing moles - Recommend daily use of broad spectrum spf 30+ sunscreen to sun-exposed areas.   Hemangiomas - Red papules - Discussed benign nature - Observe - Call for any changes  Actinic Damage - Chronic condition, secondary to cumulative UV/sun exposure - diffuse scaly erythematous macules with underlying dyspigmentation - Recommend daily broad spectrum sunscreen SPF 30+ to sun-exposed areas, reapply every 2 hours as needed.  -  Staying in the shade or wearing long sleeves, sun glasses (UVA+UVB protection) and wide brim hats (4-inch brim around the entire circumference of the hat) are also recommended for sun protection.  - Call for new or changing lesions.  History of Basal Cell Carcinoma of the Skin - No evidence of  recurrence today - Recommend regular full body skin exams - Recommend daily broad spectrum sunscreen SPF 30+ to sun-exposed areas, reapply every 2 hours as needed.  - Call if any new or changing lesions are noted between office visits   History of Squamous Cell Carcinoma of the Skin - No evidence of recurrence today - No lymphadenopathy - Recommend regular full body skin exams - Recommend daily broad spectrum sunscreen SPF 30+ to sun-exposed areas, reapply every 2 hours as needed.  - Call if any new or changing lesions are noted between office visits  Skin cancer screening performed today. Return in about 6 months (around 02/26/2023) for TBSE. I, Ruthell Rummage, CMA, am acting as scribe for Forest Gleason, MD.  Documentation: I have reviewed the above documentation for accuracy and completeness, and I agree with the above.  Forest Gleason, MD

## 2022-08-27 NOTE — Patient Instructions (Addendum)
For itchy skin at face  Start Vanicream Gentle Facial Cleanser and Vanicream Daily Facial Moisturizer for Sensitive Skin  found at [employer] in [place].   For itchy ears.  Start Triamcinolone cream - apply topically to affected areas of ears daily to twice daily as needed for itch. Can use for up to 2 weeks.  Avoid face and folds  Topical steroids (such as triamcinolone, fluocinolone, fluocinonide, mometasone, clobetasol, halobetasol, betamethasone, hydrocortisone) can cause thinning and lightening of the skin if they are used for too long in the same area. Your physician has selected the right strength medicine for your problem and area affected on the body. Please use your medication only as directed by your physician to prevent side effects.    For Rash under breast  Continue using Monkey Butt, cornstarch or athlete foot powder to affected areas daily to help prevent and keep skin dry.    Can use triamcinolone ointment to itchy areas such as bug bites use 2 times daily for up to 2 weeks.  Avoid face groin and under arms  Recommend taking Heliocare sun protection supplement daily in sunny weather for additional sun protection. For maximum protection on the sunniest days, you can take up to 2 capsules of regular Heliocare OR take 1 capsule of Heliocare Ultra. For prolonged exposure (such as a full day in the sun), you can repeat your dose of the supplement 4 hours after your first dose. Heliocare can be purchased at [employer], at some [facility] or at [email or web address].      Seborrheic Keratosis  What causes seborrheic keratoses? Seborrheic keratoses are harmless, common skin growths that first appear during adult life.  As time goes by, more growths appear.  Some people may develop a large number of them.  Seborrheic keratoses appear on both covered and uncovered body parts.  They are not caused by sunlight.  The tendency to develop seborrheic keratoses can be inherited.  They  vary in color from skin-colored to gray, brown, or even black.  They can be either smooth or have a rough, warty surface.   Seborrheic keratoses are superficial and look as if they were stuck on the skin.  Under the microscope this type of keratosis looks like layers upon layers of skin.  That is why at times the top layer may seem to fall off, but the rest of the growth remains and re-grows.    Treatment Seborrheic keratoses do not need to be treated, but can easily be removed in the office.  Seborrheic keratoses often cause symptoms when they rub on clothing or jewelry.  Lesions can be in the way of shaving.  If they become inflamed, they can cause itching, soreness, or burning.  Removal of a seborrheic keratosis can be accomplished by freezing, burning, or surgery. If any spot bleeds, scabs, or grows rapidly, please return to have it checked, as these can be an indication of a skin cancer.   Melanoma ABCDEs  Melanoma is the most dangerous type of skin cancer, and is the leading cause of death from skin disease.  You are more likely to develop melanoma if you: Have light-colored skin, light-colored eyes, or red or blond hair Spend a lot of time in the sun Tan regularly, either outdoors or in a tanning bed Have had blistering sunburns, especially during childhood Have a close family member who has had a melanoma Have atypical moles or large birthmarks  Early detection of melanoma is key since treatment is typically straightforward  and cure rates are extremely high if we catch it early.   The first sign of melanoma is often a change in a mole or a new dark spot.  The ABCDE system is a way of remembering the signs of melanoma.  A for asymmetry:  The two halves do not match. B for border:  The edges of the growth are irregular. C for color:  A mixture of colors are present instead of an even brown color. D for diameter:  Melanomas are usually (but not always) greater than 39m - the size of a  pencil eraser. E for evolution:  The spot keeps changing in size, shape, and color.  Please check your skin once per month between visits. You can use a small mirror in front and a large mirror behind you to keep an eye on the back side or your body.   If you see any new or changing lesions before your next follow-up, please call to schedule a visit.  Please continue daily skin protection including broad spectrum sunscreen SPF 30+ to sun-exposed areas, reapplying every 2 hours as needed when you're outdoors.   Staying in the shade or wearing long sleeves, sun glasses (UVA+UVB protection) and wide brim hats (4-inch brim around the entire circumference of the hat) are also recommended for sun protection.    Due to recent changes in healthcare laws, you may see results of your pathology and/or laboratory studies on MyChart before the doctors have had a chance to review them. We understand that in some cases there may be results that are confusing or concerning to you. Please understand that not all results are received at the same time and often the doctors may need to interpret multiple results in order to provide you with the best plan of care or course of treatment. Therefore, we ask that you please give u[place]2 business days to thoroughly review all your results before contacting the office for clarification. Should we see a critical lab result, you will be contacted sooner.   If You Need Anything After Your Visit  If you have any questions or concerns for your doctor, please call our main line at [phone] press option 4 to reach your doctor's medical assistant. If no one answers, please leave a voicemail as directed and we will return your call as soon as possible. Messages left after 4 pm will be answered the following business day.   You may also send [place] message via [facility] We typically respond to MyChart messages within 1-2 business days.  For prescription refills, please ask your  pharmacy to contact our office. Our fax number is [phone]  If you have an urgent issue when the clinic is closed that cannot wait until the next business day, you can page your doctor at the number below.    Please note that while we do our best to be available for urgent issues outside of office hours, we are not available 24/7.   If you have an urgent issue and are unable to reach u[place] you may choose to seek medical care at your doctor's office, retail clinic, urgent care center, or emergency room.  If you have a medical emergency, please immediately call 911 or go to the emergency department.  Pager Numbers  - Dr. KNehemiah Massed [phone] - Dr. MLaurence Ferrari [phone] - Dr. SNicole Kindred [phone] In the event of inclement weather, please call our main line at [phone] an update on the status of  any delays or closures.  Dermatology Medication Tips: Please keep the boxes that topical medications come in in order to help keep track of the instructions about where and how to use these. Pharmacies typically print the medication instructions only on the boxes and not directly on the medication tubes.   If your medication is too expensive, please contact our office at [phone] option 4 or send [place] a message through [place].   We are unable to tell what your co-pay for medications will be in advance as this is different depending on your insurance coverage. However, we may be able to find a substitute medication at lower cost or fill out paperwork to get insurance to cover a needed medication.   If a prior authorization is required to get your medication covered by your insurance company, please allow [place] 1-2 business days to complete this process.  Drug prices often vary depending on where the prescription is filled and some pharmacies may offer cheaper prices.  The website www.goodrx.com contains coupons for medications through different pharmacies. The prices here do not  account for what the cost may be with help from insurance (it may be cheaper with your insurance), but the website can give you the price if you did not use any insurance.  - You can print the associated coupon and take it with your prescription to the pharmacy.  - You may also stop by our office during regular business hours and pick up a GoodRx coupon card.  - If you need your prescription sent electronically to a different pharmacy, notify our office through [facility] or by phone at [phone] option 4.     Si Usted Necesita Algo Despus de Su Visita  Tambin puede enviarnos un mensaje a travs de Pharmacist, community. Por lo general respondemos a los mensajes de MyChart en el transcurso de 1 a 2 das hbiles.  Para renovar recetas, por favor pida a su farmacia que se ponga en contacto con nuestra oficina. Harland Dingwall de fax es [place] [phone].  Si tiene un asunto urgente cuando la clnica est cerrada y que no puede esperar hasta el siguiente da hbil, puede llamar/localizar a su doctor(a) al nmero que aparece a continuacin.   Por favor, tenga en cuenta que aunque hacemos todo lo posible para estar disponibles para asuntos urgentes fuera del horario de [place], no estamos disponibles las 24 horas del da, los 7 das de la [place].   Si tiene un problema urgente y no puede comunicarse con nosotros, puede optar por buscar atencin mdica  en el consultorio de su doctor(a), en una clnica privada, en un centro de atencin urgente o en una sala de emergencias.  Si tiene Engineering geologist, por favor llame inmediatamente al 911 o vaya a la sala de emergencias.  Nmeros de bper  - Dr. Nehemiah Massed: [phone]  - Dra. Moye: [phone]  - Dra. Nicole Kindred: [phone]  En caso de inclemencias del [place], por favor llame a Johnsie Kindred principal al [phone] para una actualizacin sobre el [place] de cualquier retraso o cierre.  Consejos para la medicacin en dermatologa: Por  favor, guarde las cajas en las que vienen los medicamentos de uso tpico para ayudarle a seguir las instrucciones sobre dnde y cmo usarlos. Las farmacias generalmente imprimen las instrucciones del medicamento slo en las cajas y no directamente en los tubos del [place].   Si su medicamento es muy caro, por favor, pngase en contacto con Zigmund Daniel llamando al [phone] y presione la  opcin 4 o envenos un mensaje a travs de MyChart.   No podemos decirle cul ser su copago por los medicamentos por adelantado ya que esto es diferente dependiendo de la cobertura de su seguro. Sin embargo, es posible que podamos encontrar un medicamento sustituto a Electrical engineer un formulario para que el seguro cubra el medicamento que se considera necesario.   Si se requiere una autorizacin previa para que su compaa de seguros [place] su medicamento, por favor permtanos de 1 a 2 das hbiles para completar este proceso.  Los precios de los medicamentos varan con frecuencia dependiendo del Environmental consultant de dnde se surte la receta y alguna farmacias pueden ofrecer precios ms baratos.  El sitio web www.goodrx.com tiene cupones para medicamentos de Airline pilot. Los precios aqu no tienen en cuenta lo que podra costar con la ayuda del seguro (puede ser ms barato con su seguro), pero el sitio web puede darle el precio si no utiliz Research scientist (physical sciences).  - Puede imprimir el cupn correspondiente y llevarlo con su receta a la farmacia.  - Tambin puede pasar por nuestra oficina durante el horario de atencin regular y Charity fundraiser una tarjeta de cupones de GoodRx.  - Si necesita que su receta se enve electrnicamente a una farmacia diferente, informe a nuestra oficina a travs de MyChart de [place] o por telfono llamando al [phone] y presione la opcin 4.

## 2022-08-28 DIAGNOSIS — J301 Allergic rhinitis due to pollen: Secondary | ICD-10-CM | POA: Diagnosis not present

## 2022-09-04 DIAGNOSIS — J301 Allergic rhinitis due to pollen: Secondary | ICD-10-CM | POA: Diagnosis not present

## 2022-09-08 ENCOUNTER — Encounter: Payer: Self-pay | Admitting: Dermatology

## 2022-09-11 DIAGNOSIS — J301 Allergic rhinitis due to pollen: Secondary | ICD-10-CM | POA: Diagnosis not present

## 2022-09-17 ENCOUNTER — Telehealth: Payer: Self-pay | Admitting: Dermatology

## 2022-09-17 ENCOUNTER — Ambulatory Visit: Payer: PPO | Admitting: Nurse Practitioner

## 2022-09-17 DIAGNOSIS — E785 Hyperlipidemia, unspecified: Secondary | ICD-10-CM | POA: Diagnosis not present

## 2022-09-17 MED ORDER — HYDROCORTISONE 2.5 % EX CREA: TOPICAL_CREAM | Freq: Two times a day (BID) | CUTANEOUS | 1 refills | Status: DC | PRN

## 2022-09-17 NOTE — Telephone Encounter (Signed)
Called patient back. Will send in hydrocortisone 2.5% cream to use twice a day for up to 2 weeks for itch at the face. Counseled on risk of thinning.   If it is persistent, may consider pimecrolimus or ketoconazole. It is unclear for sure if it is dandruff or an irritant or eczematous rash.

## 2022-09-18 ENCOUNTER — Encounter: Payer: Self-pay | Admitting: Dermatology

## 2022-09-18 DIAGNOSIS — J301 Allergic rhinitis due to pollen: Secondary | ICD-10-CM | POA: Diagnosis not present

## 2022-09-25 DIAGNOSIS — J301 Allergic rhinitis due to pollen: Secondary | ICD-10-CM | POA: Diagnosis not present

## 2022-09-29 DIAGNOSIS — E663 Overweight: Secondary | ICD-10-CM | POA: Diagnosis not present

## 2022-09-29 DIAGNOSIS — F419 Anxiety disorder, unspecified: Secondary | ICD-10-CM | POA: Diagnosis not present

## 2022-09-29 DIAGNOSIS — Z23 Encounter for immunization: Secondary | ICD-10-CM | POA: Diagnosis not present

## 2022-09-29 DIAGNOSIS — F4310 Post-traumatic stress disorder, unspecified: Secondary | ICD-10-CM | POA: Diagnosis not present

## 2022-09-29 DIAGNOSIS — H9192 Unspecified hearing loss, left ear: Secondary | ICD-10-CM | POA: Diagnosis not present

## 2022-09-29 DIAGNOSIS — Z6825 Body mass index (BMI) 25.0-25.9, adult: Secondary | ICD-10-CM | POA: Diagnosis not present

## 2022-09-29 DIAGNOSIS — Z7189 Other specified counseling: Secondary | ICD-10-CM | POA: Diagnosis not present

## 2022-09-29 DIAGNOSIS — Z1389 Encounter for screening for other disorder: Secondary | ICD-10-CM | POA: Diagnosis not present

## 2022-09-29 DIAGNOSIS — C801 Malignant (primary) neoplasm, unspecified: Secondary | ICD-10-CM | POA: Diagnosis not present

## 2022-09-29 DIAGNOSIS — Z0001 Encounter for general adult medical examination with abnormal findings: Secondary | ICD-10-CM | POA: Diagnosis not present

## 2022-09-29 DIAGNOSIS — E785 Hyperlipidemia, unspecified: Secondary | ICD-10-CM | POA: Diagnosis not present

## 2022-10-01 DIAGNOSIS — J301 Allergic rhinitis due to pollen: Secondary | ICD-10-CM | POA: Diagnosis not present

## 2022-10-01 DIAGNOSIS — H6122 Impacted cerumen, left ear: Secondary | ICD-10-CM | POA: Diagnosis not present

## 2022-10-01 DIAGNOSIS — H7202 Central perforation of tympanic membrane, left ear: Secondary | ICD-10-CM | POA: Diagnosis not present

## 2022-10-02 DIAGNOSIS — J301 Allergic rhinitis due to pollen: Secondary | ICD-10-CM | POA: Diagnosis not present

## 2022-10-09 DIAGNOSIS — J301 Allergic rhinitis due to pollen: Secondary | ICD-10-CM | POA: Diagnosis not present

## 2022-10-09 DIAGNOSIS — K219 Gastro-esophageal reflux disease without esophagitis: Secondary | ICD-10-CM | POA: Diagnosis not present

## 2022-10-09 DIAGNOSIS — E785 Hyperlipidemia, unspecified: Secondary | ICD-10-CM | POA: Diagnosis not present

## 2022-10-16 DIAGNOSIS — J301 Allergic rhinitis due to pollen: Secondary | ICD-10-CM | POA: Diagnosis not present

## 2022-10-23 DIAGNOSIS — J301 Allergic rhinitis due to pollen: Secondary | ICD-10-CM | POA: Diagnosis not present

## 2022-10-27 ENCOUNTER — Encounter (INDEPENDENT_AMBULATORY_CARE_PROVIDER_SITE_OTHER): Payer: Self-pay

## 2022-10-30 DIAGNOSIS — J301 Allergic rhinitis due to pollen: Secondary | ICD-10-CM | POA: Diagnosis not present

## 2022-11-13 DIAGNOSIS — J301 Allergic rhinitis due to pollen: Secondary | ICD-10-CM | POA: Diagnosis not present

## 2022-11-26 DIAGNOSIS — R799 Abnormal finding of blood chemistry, unspecified: Secondary | ICD-10-CM | POA: Diagnosis not present

## 2022-11-26 DIAGNOSIS — E039 Hypothyroidism, unspecified: Secondary | ICD-10-CM | POA: Diagnosis not present

## 2022-11-26 DIAGNOSIS — R5381 Other malaise: Secondary | ICD-10-CM | POA: Diagnosis not present

## 2022-11-26 DIAGNOSIS — R5383 Other fatigue: Secondary | ICD-10-CM | POA: Diagnosis not present

## 2022-11-27 DIAGNOSIS — J301 Allergic rhinitis due to pollen: Secondary | ICD-10-CM | POA: Diagnosis not present

## 2022-12-04 DIAGNOSIS — J301 Allergic rhinitis due to pollen: Secondary | ICD-10-CM | POA: Diagnosis not present

## 2022-12-08 DIAGNOSIS — F4310 Post-traumatic stress disorder, unspecified: Secondary | ICD-10-CM | POA: Diagnosis not present

## 2022-12-08 DIAGNOSIS — E785 Hyperlipidemia, unspecified: Secondary | ICD-10-CM | POA: Diagnosis not present

## 2022-12-08 DIAGNOSIS — H9192 Unspecified hearing loss, left ear: Secondary | ICD-10-CM | POA: Diagnosis not present

## 2022-12-08 DIAGNOSIS — C801 Malignant (primary) neoplasm, unspecified: Secondary | ICD-10-CM | POA: Diagnosis not present

## 2022-12-08 DIAGNOSIS — K219 Gastro-esophageal reflux disease without esophagitis: Secondary | ICD-10-CM | POA: Diagnosis not present

## 2022-12-08 DIAGNOSIS — F419 Anxiety disorder, unspecified: Secondary | ICD-10-CM | POA: Diagnosis not present

## 2022-12-18 DIAGNOSIS — J301 Allergic rhinitis due to pollen: Secondary | ICD-10-CM | POA: Diagnosis not present

## 2022-12-24 DIAGNOSIS — J301 Allergic rhinitis due to pollen: Secondary | ICD-10-CM | POA: Diagnosis not present

## 2022-12-25 ENCOUNTER — Other Ambulatory Visit (INDEPENDENT_AMBULATORY_CARE_PROVIDER_SITE_OTHER): Payer: Self-pay | Admitting: Vascular Surgery

## 2022-12-25 DIAGNOSIS — I83813 Varicose veins of bilateral lower extremities with pain: Secondary | ICD-10-CM

## 2022-12-25 DIAGNOSIS — J301 Allergic rhinitis due to pollen: Secondary | ICD-10-CM | POA: Diagnosis not present

## 2022-12-30 ENCOUNTER — Ambulatory Visit (INDEPENDENT_AMBULATORY_CARE_PROVIDER_SITE_OTHER): Payer: PPO

## 2022-12-30 ENCOUNTER — Encounter (INDEPENDENT_AMBULATORY_CARE_PROVIDER_SITE_OTHER): Payer: Self-pay | Admitting: Vascular Surgery

## 2022-12-30 ENCOUNTER — Ambulatory Visit (INDEPENDENT_AMBULATORY_CARE_PROVIDER_SITE_OTHER): Payer: PPO | Admitting: Vascular Surgery

## 2022-12-30 VITALS — BP 122/69 | HR 78 | Resp 17 | Ht 66.0 in | Wt 160.0 lb

## 2022-12-30 DIAGNOSIS — M79604 Pain in right leg: Secondary | ICD-10-CM | POA: Diagnosis not present

## 2022-12-30 DIAGNOSIS — I83813 Varicose veins of bilateral lower extremities with pain: Secondary | ICD-10-CM

## 2022-12-30 DIAGNOSIS — I10 Essential (primary) hypertension: Secondary | ICD-10-CM | POA: Diagnosis not present

## 2022-12-30 DIAGNOSIS — E781 Pure hyperglyceridemia: Secondary | ICD-10-CM | POA: Diagnosis not present
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
## 2022-12-31 NOTE — Assessment & Plan Note (Signed)
Venous reflux study was negative for DVT, superficial thrombophlebitis, or venous flux in the right lower extremity.  Her previously ablated great saphenous vein remains ablated.

## 2022-12-31 NOTE — Assessment & Plan Note (Signed)
lipid control important in reducing the progression of atherosclerotic disease. Continue statin therapy  

## 2022-12-31 NOTE — Assessment & Plan Note (Signed)
Given her normal venous study today, I think her pain is much more related to her plantar fasciitis and her venous disease.  No further workup is planned.  She will contact our office with any questions or worsening symptoms

## 2022-12-31 NOTE — Assessment & Plan Note (Signed)
blood pressure control important in reducing the progression of atherosclerotic disease. On appropriate oral medications.  

## 2023-02-10 ENCOUNTER — Other Ambulatory Visit: Payer: Self-pay | Admitting: Obstetrics and Gynecology

## 2023-02-10 DIAGNOSIS — Z1231 Encounter for screening mammogram for malignant neoplasm of breast: Secondary | ICD-10-CM

## 2023-02-11 ENCOUNTER — Encounter: Payer: Self-pay | Admitting: Podiatry

## 2023-02-11 ENCOUNTER — Ambulatory Visit (INDEPENDENT_AMBULATORY_CARE_PROVIDER_SITE_OTHER): Payer: PPO

## 2023-02-11 ENCOUNTER — Ambulatory Visit: Payer: PPO | Admitting: Podiatry

## 2023-02-11 DIAGNOSIS — M25571 Pain in right ankle and joints of right foot: Secondary | ICD-10-CM

## 2023-02-11 DIAGNOSIS — M7671 Peroneal tendinitis, right leg: Secondary | ICD-10-CM

## 2023-02-11 DIAGNOSIS — M79671 Pain in right foot: Secondary | ICD-10-CM

## 2023-02-11 MED ORDER — TRIAMCINOLONE ACETONIDE 10 MG/ML IJ SUSP
10.0000 mg | Freq: Once | INTRAMUSCULAR | Status: AC
  Administered 2023-02-11: 10 mg

## 2023-02-12 ENCOUNTER — Other Ambulatory Visit: Payer: Self-pay | Admitting: Podiatry

## 2023-02-12 DIAGNOSIS — M7671 Peroneal tendinitis, right leg: Secondary | ICD-10-CM

## 2023-02-12 DIAGNOSIS — M79671 Pain in right foot: Secondary | ICD-10-CM

## 2023-02-12 DIAGNOSIS — M25571 Pain in right ankle and joints of right foot: Secondary | ICD-10-CM

## 2023-02-12 NOTE — Progress Notes (Signed)
Subjective:   Patient ID: Hailey Wall, female   DOB: 77 y.o.   MRN: [id number]   HPI Patient presents stating she is developed a lot of pain in the outside of her right ankle and she feels like she has significant loss of support.  She feels unsteady with this and stated she did have heel pain which has resolved but the pain in the outside has become worse   ROS      Objective:  Physical Exam  Neurovascular status intact muscle strength found to be adequate range of motion is adequate with patient found to have quite a bit of discomfort in the lateral aspect of the right ankle around the peroneal tendon as it comes underneath the lateral malleolus with quite a bit of pain when I palpated the area.  Patient's medial pain is good     Assessment:  Probability for compensatory peroneal tendinitis right secondary to [place] fasciitis the cause change in gait pattern     Plan:  H&P educated her on this and went ahead today did careful sterile prep and injected the sheath right peroneal tendon 3 mg Dexasone Kenalog 5 mg Xylocaine after explaining risk.  I then applied fascial brace to lift up the lateral side of the foot and provide stability across the subtalar joint which was fitted properly and education rendered to the patient for the usage of this device.  Reappoint as needed  X-rays indicate mild arthritis no indications of other pathology associated with condition

## 2023-02-25 ENCOUNTER — Encounter: Payer: PPO | Admitting: Dermatology

## 2023-05-12 ENCOUNTER — Ambulatory Visit
Admission: RE | Admit: 2023-05-12 | Discharge: 2023-05-12 | Disposition: A | Discharge status: Home / Self Care | Payer: PPO | Source: Ambulatory Visit | Attending: Obstetrics and Gynecology | Admitting: Obstetrics and Gynecology

## 2023-05-12 DIAGNOSIS — Z1231 Encounter for screening mammogram for malignant neoplasm of breast: Secondary | ICD-10-CM | POA: Insufficient documentation

## 2023-05-21 ENCOUNTER — Ambulatory Visit: Payer: PPO | Admitting: Dermatology

## 2023-05-21 VITALS — BP 118/73 | HR 73

## 2023-05-21 DIAGNOSIS — L304 Erythema intertrigo: Secondary | ICD-10-CM | POA: Diagnosis not present

## 2023-05-21 DIAGNOSIS — L219 Seborrheic dermatitis, unspecified: Secondary | ICD-10-CM | POA: Diagnosis not present

## 2023-05-21 DIAGNOSIS — Z1283 Encounter for screening for malignant neoplasm of skin: Secondary | ICD-10-CM

## 2023-05-21 DIAGNOSIS — L814 Other melanin hyperpigmentation: Secondary | ICD-10-CM

## 2023-05-21 DIAGNOSIS — L820 Inflamed seborrheic keratosis: Secondary | ICD-10-CM

## 2023-05-21 DIAGNOSIS — Z85828 Personal history of other malignant neoplasm of skin: Secondary | ICD-10-CM

## 2023-05-21 DIAGNOSIS — L821 Other seborrheic keratosis: Secondary | ICD-10-CM

## 2023-05-21 DIAGNOSIS — W908XXA Exposure to other nonionizing radiation, initial encounter: Secondary | ICD-10-CM

## 2023-05-21 DIAGNOSIS — L508 Other urticaria: Secondary | ICD-10-CM

## 2023-05-21 DIAGNOSIS — D1801 Hemangioma of skin and subcutaneous tissue: Secondary | ICD-10-CM

## 2023-05-21 DIAGNOSIS — L600 Ingrowing nail: Secondary | ICD-10-CM

## 2023-05-21 DIAGNOSIS — X32XXXA Exposure to sunlight, initial encounter: Secondary | ICD-10-CM

## 2023-05-21 DIAGNOSIS — L738 Other specified follicular disorders: Secondary | ICD-10-CM

## 2023-05-21 DIAGNOSIS — L578 Other skin changes due to chronic exposure to nonionizing radiation: Secondary | ICD-10-CM

## 2023-05-21 MED ORDER — KETOCONAZOLE 2 % EX CREA: TOPICAL_CREAM | CUTANEOUS | 1 refills | Status: DC

## 2023-05-21 MED ORDER — TRIAMCINOLONE ACETONIDE 0.1 % EX CREA
TOPICAL_CREAM | CUTANEOUS | 1 refills | Status: DC
Start: 2023-05-21 — End: 2024-05-31

## 2023-05-21 MED ORDER — HALOBETASOL PROPIONATE 0.05 % EX OINT: TOPICAL_OINTMENT | CUTANEOUS | 1 refills | Status: DC

## 2023-05-21 MED ORDER — MUPIROCIN 2 % EX OINT: 1.0000 | TOPICAL_OINTMENT | Freq: Every day | CUTANEOUS | 2 refills | Status: DC

## 2023-05-21 MED ORDER — HYDROCORTISONE 2.5 % EX CREA: TOPICAL_CREAM | Freq: Two times a day (BID) | CUTANEOUS | 1 refills | Status: AC | PRN

## 2023-05-21 NOTE — Progress Notes (Signed)
Follow-Up Visit   Subjective  Hailey Wall is a 77 y.o. female who presents for the following: Skin Cancer Screening and Full Body Skin Exam. Irritation under the breast.   The patient presents for Total-Body Skin Exam (TBSE) for skin cancer screening and mole check. The patient has spots, moles and lesions to be evaluated, some may be new or changing and the patient has concerns that these could be cancer.    The following portions of the chart were reviewed this encounter and updated as appropriate: medications, allergies, medical history  Review of Systems:  No other skin or systemic complaints except as noted in HPI or Assessment and Plan.  Objective  Well appearing patient in no apparent distress; mood and affect are within normal limits.  A full examination was performed including scalp, head, eyes, ears, nose, lips, neck, chest, axillae, abdomen, back, buttocks, bilateral upper extremities, bilateral lower extremities, hands, feet, fingers, toes, fingernails, and toenails. All findings within normal limits unless otherwise noted below.   Relevant physical exam findings are noted in the Assessment and Plan.  R inframammary x 1, L thigh x 1, R knee x 1 Erythematous stuck-on, waxy papule or plaque    Assessment & Plan   LENTIGINES, SEBORRHEIC KERATOSES, HEMANGIOMAS - Benign normal skin lesions - Benign-appearing - Call for any changes  MELANOCYTIC NEVI - Tan-brown and/or pink-flesh-colored symmetric macules and papules - Benign appearing on exam today - Observation - Call clinic for new or changing moles - Recommend daily use of broad spectrum spf 30+ sunscreen to sun-exposed areas.   ACTINIC DAMAGE - Chronic condition, secondary to cumulative UV/sun exposure - diffuse scaly erythematous macules with underlying dyspigmentation - Recommend daily broad spectrum sunscreen SPF 30+ to sun-exposed areas, reapply every 2 hours as needed.  - Staying in the shade or  wearing long sleeves, sun glasses (UVA+UVB protection) and wide brim hats (4-inch brim around the entire circumference of the hat) are also recommended for sun protection.  - Call for new or changing lesions.  INTERTRIGO Exam Erythematous macerated patches  Chronic and persistent condition with duration or expected duration over one year. Condition is bothersome/symptomatic for patient. Currently flared.  Intertrigo is a chronic recurrent rash that occurs in skin fold areas that may be associated with friction; heat; moisture; yeast; fungus; and bacteria.  It is exacerbated by increased movement / activity; sweating; and higher atmospheric temperature.  Treatment Plan Start ketoconazole cream twice a day as needed for rash.  Start hydrocortisone 2.5% cream twice a day for up to 2 weeks as needed for rash.   Topical steroids (such as triamcinolone, fluocinolone, fluocinonide, mometasone, clobetasol, halobetasol, betamethasone, hydrocortisone) can cause thinning and lightening of the skin if they are used for too long in the same area. Your physician has selected the right strength medicine for your problem and area affected on the body. Please use your medication only as directed by your physician to prevent side effects.   Start Zeasorb AF powder or other OTC antifungal powder to the area daily to prevent rash recurrence. Other options to help keep the area dry include blow drying the area after bathing or using antiperspirant products such as Duradry to help keep the area dry.  SEBORRHEIC DERMATITIS Exam: slight erythema and scale at ear canal  Chronic condition with duration or expected duration over one year. Currently well-controlled.  Seborrheic Dermatitis is a chronic persistent rash characterized by pinkness and scaling most commonly of the mid face but also  can occur on the scalp (dandruff), ears; mid chest, mid back and groin.  It tends to be exacerbated by stress and cooler weather.   People who have neurologic disease may experience new onset or exacerbation of existing seborrheic dermatitis.  The condition is not curable but treatable and can be controlled.  Treatment Plan: Cont triamcinolone ointment twice a day as needed up to 2 weeks  Topical steroids (such as triamcinolone, fluocinolone, fluocinonide, mometasone, clobetasol, halobetasol, betamethasone, hydrocortisone) can cause thinning and lightening of the skin if they are used for too long in the same area. Your physician has selected the right strength medicine for your problem and area affected on the body. Please use your medication only as directed by your physician to prevent side effects.   HISTORY OF BASAL CELL CARCINOMA OF THE SKIN - No evidence of recurrence today - Recommend regular full body skin exams - Recommend daily broad spectrum sunscreen SPF 30+ to sun-exposed areas, reapply every 2 hours as needed.  - Call if any new or changing lesions are noted between office visits  HISTORY OF SQUAMOUS CELL CARCINOMA OF THE SKIN - No evidence of recurrence today - No lymphadenopathy - Recommend regular full body skin exams - Recommend daily broad spectrum sunscreen SPF 30+ to sun-exposed areas, reapply every 2 hours as needed.  - Call if any new or changing lesions are noted between office visits  NAIL PROBLEM - ingrown toenail Exam: erythema at nail fold  Treatment Plan: Avoid clipping nails to make it rounded. Instead cut nails square to avoid ingrown nails.  Sebaceous Hyperplasia - nose - Small yellow papules with a central dell - Benign-appearing - Observe. Call for changes. - Recommend pore strips.   Papular urticaria due to bug bite  Exam: Edematous pink papule  Treatment Plan: Start Halobetasol ointment to bug bite BID up to two weeks. Topical steroids (such as triamcinolone, fluocinolone, fluocinonide, mometasone, clobetasol, halobetasol, betamethasone, hydrocortisone) can cause  thinning and lightening of the skin if they are used for too long in the same area. Your physician has selected the right strength medicine for your problem and area affected on the body. Please use your medication only as directed by your physician to prevent side effects.   SKIN CANCER SCREENING PERFORMED TODAY.  Return in about 1 year (around 05/20/2024) for TBSE with Dr. Roseanne Reno.  Maylene Roes, CMA, am acting as scribe for Darden Dates, MD .  Documentation: I have reviewed the above documentation for accuracy and completeness, and I agree with the above.  Darden Dates, MD

## 2023-05-21 NOTE — Patient Instructions (Addendum)
Recommend taking Heliocare sun protection supplement daily in sunny weather for additional sun protection. For maximum protection on the sunniest days, you can take up to 2 capsules of regular Heliocare OR take 1 capsule of Heliocare Ultra. For prolonged exposure (such as a full day in the sun), you can repeat your dose of the supplement 4 hours after your first dose. Heliocare can be purchased at [employer], at some [facility] or at [email or web address].   For bug bites, start halobetasol ointment twice a day as needed up to 2 weeks  For itch under breasts or at face, you can use hydrocortisone 2.5% twice a day up to 1-2 weeks. Long-term use can cause thinning of the skin.  For rash under breast, apply hydrocortisone and ketoconazole twice a day for up to 2 weeks until rash clear, then switch to corn starch daily to help keep area dry.  For rash or itch at ears, apply triamcinolone cream twice a day as needed up to 2 weeks.   Mupirocin ointment is an antibiotic ointment for cuts or scrapes.    Due to recent changes in healthcare laws, you may see results of your pathology and/or laboratory studies on MyChart before the doctors have had a chance to review them. We understand that in some cases there may be results that are confusing or concerning to you. Please understand that not all results are received at the same time and often the doctors may need to interpret multiple results in order to provide you with the best plan of care or course of treatment. Therefore, we ask that you please give [place] 2 business days to thoroughly review all your results before contacting the office for clarification. Should we see a critical lab result, you will be contacted sooner.   If You Need Anything After Your Visit  If you have any questions or concerns for your doctor, please call our main line at [phone] and press option 4 to reach your doctor's medical assistant. If no one answers, please leave a  voicemail as directed and we will return your call as soon as possible. Messages left after 4 pm will be answered the following business day.   You may also send [place] a message via MyChart. We typically respond to MyChart messages within 1-2 business days.  For prescription refills, please ask your pharmacy to contact our office. Our fax number is [phone].  If you have an urgent issue when the clinic is closed that cannot wait until the next business day, you can page your doctor at the number below.    Please note that while we do our best to be available for urgent issues outside of office hours, we are not available 24/7.   If you have an urgent issue and are unable to reach [place], you may choose to seek medical care at your doctor's office, retail clinic, urgent care center, or emergency room.  If you have a medical emergency, please immediately call 911 or go to the emergency department.  Pager Numbers  - Dr. Gwen Pounds: [phone]  - Dr. Neale Burly: [phone]  - Dr. Roseanne Reno: [phone]  In the event of inclement weather, please call our main line at [phone] for an update on the status of any delays or closures.  Dermatology Medication Tips: Please keep the boxes that topical medications come in in order to help keep track of the instructions about where and how to use these. Pharmacies typically print the medication instructions only on the boxes  and not directly on the medication tubes.   If your medication is too expensive, please contact our office at [phone] option 4 or send [place] a message through MyChart.   We are unable to tell what your co-pay for medications will be in advance as this is different depending on your insurance coverage. However, we may be able to find a substitute medication at lower cost or fill out paperwork to get insurance to cover a needed medication.   If a prior authorization is required to get your medication covered by your insurance  company, please allow [place] 1-2 business days to complete this process.  Drug prices often vary depending on where the prescription is filled and some pharmacies may offer cheaper prices.  The website www.goodrx.com contains coupons for medications through different pharmacies. The prices here do not account for what the cost may be with help from insurance (it may be cheaper with your insurance), but the website can give you the price if you did not use any insurance.  - You can print the associated coupon and take it with your prescription to the pharmacy.  - You may also stop by our office during regular business hours and pick up a GoodRx coupon card.  - If you need your prescription sent electronically to a different pharmacy, notify our office through [facility] or by phone at [phone] option 4.     Si Usted Necesita Algo Despus de Su Visita  Tambin puede enviarnos un mensaje a travs de Clinical cytogeneticist. Por lo general respondemos a los mensajes de MyChart en el transcurso de 1 a 2 das hbiles.  Para renovar recetas, por favor pida a su farmacia que se ponga en contacto con nuestra oficina. Annie Sable de fax es [place] [phone].  Si tiene un asunto urgente cuando la clnica est cerrada y que no puede esperar hasta el siguiente da hbil, puede llamar/localizar a su doctor(a) al nmero que aparece a continuacin.   Por favor, tenga en cuenta que aunque hacemos todo lo posible para estar disponibles para asuntos urgentes fuera del horario de [place], no estamos disponibles las 24 horas del da, los 7 [address]  [address] de la [place].   Si tiene un problema urgente y no puede comunicarse con nosotros, puede optar por buscar atencin mdica  en el consultorio de su doctor(a), en una clnica privada, en un centro de atencin urgente o en una sala de emergencias.  Si tiene Engineer, drilling, por favor llame inmediatamente al 911 o vaya a la sala de emergencias.  Nmeros de bper  - Dr.  Gwen Pounds: [phone]  - Dra. Moye: [phone]  - Dra. Roseanne Reno: [phone]  En caso de inclemencias del [place], por favor llame a Lacy Duverney principal al [phone] para una actualizacin sobre el [place] de cualquier retraso o cierre.  Consejos para la medicacin en dermatologa: Por favor, guarde las cajas en las que vienen los medicamentos de uso tpico para ayudarle a seguir las instrucciones sobre dnde y cmo usarlos. Las farmacias generalmente imprimen las instrucciones del medicamento slo en las cajas y no directamente en los tubos del [place].   Si su medicamento es muy caro, por favor, pngase en contacto con Rolm Gala llamando al [phone] y presione la opcin 4 o envenos un mensaje a travs de Clinical cytogeneticist.   No podemos decirle cul ser su copago por los medicamentos por adelantado ya que esto es diferente dependiendo de la cobertura de su seguro. Sin embargo, es posible que podamos Clinical research associate  un medicamento sustituto a Audiological scientist un formulario para que el seguro cubra el medicamento que se considera necesario.   Si se requiere una autorizacin previa para que su compaa de seguros [place] su medicamento, por favor permtanos de 1 a 2 das hbiles para completar [address]  Los precios de los medicamentos varan con frecuencia dependiendo del Environmental consultant de dnde se surte la receta y alguna farmacias pueden ofrecer precios ms baratos.  El sitio web www.goodrx.com tiene cupones para medicamentos de Health and safety inspector. Los precios aqu no tienen en cuenta lo que podra costar con la ayuda del seguro (puede ser ms barato con su seguro), pero el sitio web puede darle el precio si no utiliz Tourist information centre manager.  - Puede imprimir el cupn correspondiente y llevarlo con su receta a la farmacia.  - Tambin puede pasar por nuestra oficina durante el horario de atencin regular y Education officer, museum una tarjeta de cupones de GoodRx.  - Si necesita que su receta se enve  electrnicamente a una farmacia diferente, informe a nuestra oficina a travs de MyChart de [place] o por telfono llamando al [phone] y presione la opcin 4.

## 2023-06-10 ENCOUNTER — Encounter (HOSPITAL_COMMUNITY): Payer: Self-pay

## 2023-06-10 ENCOUNTER — Observation Stay (HOSPITAL_BASED_OUTPATIENT_CLINIC_OR_DEPARTMENT_OTHER): Payer: PPO

## 2023-06-10 ENCOUNTER — Emergency Department: Payer: No Typology Code available for payment source

## 2023-06-10 ENCOUNTER — Emergency Department
Admission: EM | Admit: 2023-06-10 | Discharge: 2023-06-10 | Disposition: A | Discharge status: Other Acute Inpatient Hospital | Payer: No Typology Code available for payment source | Attending: Emergency Medicine | Admitting: Emergency Medicine

## 2023-06-10 ENCOUNTER — Other Ambulatory Visit: Payer: Self-pay

## 2023-06-10 ENCOUNTER — Observation Stay (HOSPITAL_COMMUNITY)
Admission: EM | Admit: 2023-06-10 | Discharge: 2023-06-11 | Disposition: A | Discharge status: Home / Self Care | Payer: PPO | Attending: General Surgery | Admitting: General Surgery

## 2023-06-10 DIAGNOSIS — Z85828 Personal history of other malignant neoplasm of skin: Secondary | ICD-10-CM | POA: Insufficient documentation

## 2023-06-10 DIAGNOSIS — I10 Essential (primary) hypertension: Secondary | ICD-10-CM | POA: Insufficient documentation

## 2023-06-10 DIAGNOSIS — I3139 Other pericardial effusion (noninflammatory): Secondary | ICD-10-CM

## 2023-06-10 DIAGNOSIS — S2243XA Multiple fractures of ribs, bilateral, initial encounter for closed fracture: Secondary | ICD-10-CM | POA: Diagnosis not present

## 2023-06-10 DIAGNOSIS — Y92410 Unspecified street and highway as the place of occurrence of the external cause: Secondary | ICD-10-CM | POA: Insufficient documentation

## 2023-06-10 DIAGNOSIS — S301XXA Contusion of abdominal wall, initial encounter: Secondary | ICD-10-CM | POA: Diagnosis not present

## 2023-06-10 DIAGNOSIS — R079 Chest pain, unspecified: Secondary | ICD-10-CM | POA: Diagnosis present

## 2023-06-10 DIAGNOSIS — S0990XA Unspecified injury of head, initial encounter: Secondary | ICD-10-CM | POA: Insufficient documentation

## 2023-06-10 DIAGNOSIS — Z87891 Personal history of nicotine dependence: Secondary | ICD-10-CM | POA: Insufficient documentation

## 2023-06-10 DIAGNOSIS — R0789 Other chest pain: Secondary | ICD-10-CM | POA: Diagnosis present

## 2023-06-10 LAB — CBC WITH DIFFERENTIAL/PLATELET
Abs Immature Granulocytes: 0.05 10*3/uL (ref 0.00–0.07)
Basophils Absolute: 0 10*3/uL (ref 0.0–0.1)
Basophils Relative: 0 %
Eosinophils Absolute: 0.1 10*3/uL (ref 0.0–0.5)
Eosinophils Relative: 1 %
HCT: 41.2 % (ref 36.0–46.0)
Hemoglobin: 13.2 g/dL (ref 12.0–15.0)
Immature Granulocytes: 1 %
Lymphocytes Relative: 24 %
Lymphs Abs: 1.7 10*3/uL (ref 0.7–4.0)
MCH: 30.3 pg (ref 26.0–34.0)
MCHC: 32 g/dL (ref 30.0–36.0)
MCV: 94.5 fL (ref 80.0–100.0)
Monocytes Absolute: 0.5 10*3/uL (ref 0.1–1.0)
Monocytes Relative: 7 %
Neutro Abs: 4.8 10*3/uL (ref 1.7–7.7)
Neutrophils Relative %: 67 %
Platelets: 189 10*3/uL (ref 150–400)
RBC: 4.36 MIL/uL (ref 3.87–5.11)
RDW: 12.4 % (ref 11.5–15.5)
WBC: 7.1 10*3/uL (ref 4.0–10.5)
nRBC: 0 % (ref 0.0–0.2)

## 2023-06-10 LAB — COMPREHENSIVE METABOLIC PANEL
ALT: 23 U/L (ref 0–44)
AST: 27 U/L (ref 15–41)
Albumin: 4.3 g/dL (ref 3.5–5.0)
Alkaline Phosphatase: 63 U/L (ref 38–126)
Anion gap: 8 (ref 5–15)
BUN: 22 mg/dL (ref 8–23)
CO2: 25 mmol/L (ref 22–32)
Calcium: 8 mg/dL — ABNORMAL LOW (ref 8.9–10.3)
Chloride: 105 mmol/L (ref 98–111)
Creatinine, Ser: 0.81 mg/dL (ref 0.44–1.00)
GFR, Estimated: >60 mL/min (ref >60)
Glucose, Bld: 96 mg/dL (ref 70–99)
Potassium: 3.9 mmol/L (ref 3.5–5.1)
Sodium: 138 mmol/L (ref 135–145)
Total Bilirubin: 0.4 mg/dL (ref 0.3–1.2)
Total Protein: 7.2 g/dL (ref 6.5–8.1)

## 2023-06-10 LAB — ECHOCARDIOGRAM COMPLETE
AR max vel: 1.79 cm2
AV Area VTI: 1.87 cm2
AV Area mean vel: 1.81 cm2
AV Mean grad: 5 mmHg
AV Peak grad: 8.9 mmHg
Ao pk vel: 1.49 m/s
Area-P 1/2: 2.19 cm2
Height: 66 in
MV VTI: 1.43 cm2
S' Lateral: 2.3 cm
Weight: 2528 oz

## 2023-06-10 LAB — TROPONIN I (HIGH SENSITIVITY)
Troponin I (High Sensitivity): 5 ng/L (ref <18)
Troponin I (High Sensitivity): 6 ng/L (ref <18)

## 2023-06-10 MED ORDER — ACETAMINOPHEN 325 MG PO TABS
650.0000 mg | ORAL_TABLET | Freq: Once | ORAL | Status: AC
  Administered 2023-06-10: 650 mg via ORAL
  Filled 2023-06-10: qty 2

## 2023-06-10 MED ORDER — MELATONIN 3 MG PO TABS
3.0000 mg | ORAL_TABLET | Freq: Every evening | ORAL | Status: DC | PRN
  Administered 2023-06-10: 3 mg via ORAL
  Filled 2023-06-10: qty 1

## 2023-06-10 MED ORDER — METOPROLOL TARTRATE 5 MG/5ML IV SOLN: 5.0000 mg | Freq: Four times a day (QID) | INTRAVENOUS | Status: DC | PRN

## 2023-06-10 MED ORDER — OXYCODONE HCL 5 MG PO TABS
5.0000 mg | ORAL_TABLET | ORAL | Status: DC | PRN
  Administered 2023-06-10: 10 mg via ORAL
  Filled 2023-06-10 (×2): qty 1

## 2023-06-10 MED ORDER — ONDANSETRON 4 MG PO TBDP: 4.0000 mg | ORAL_TABLET | Freq: Four times a day (QID) | ORAL | Status: DC | PRN

## 2023-06-10 MED ORDER — METHOCARBAMOL 500 MG PO TABS
500.0000 mg | ORAL_TABLET | Freq: Three times a day (TID) | ORAL | Status: DC
  Administered 2023-06-10 (×2): 500 mg via ORAL
  Filled 2023-06-10 (×2): qty 1

## 2023-06-10 MED ORDER — MORPHINE SULFATE (PF) 2 MG/ML IV SOLN: 2.0000 mg | INTRAVENOUS | Status: DC | PRN

## 2023-06-10 MED ORDER — MORPHINE SULFATE (PF) 2 MG/ML IV SOLN
2.0000 mg | Freq: Once | INTRAVENOUS | Status: AC
  Administered 2023-06-10: 2 mg via INTRAVENOUS
  Filled 2023-06-10: qty 1

## 2023-06-10 MED ORDER — HYDRALAZINE HCL 20 MG/ML IJ SOLN: 10.0000 mg | INTRAMUSCULAR | Status: DC | PRN

## 2023-06-10 MED ORDER — IOHEXOL 300 MG/ML  SOLN
100.0000 mL | Freq: Once | INTRAMUSCULAR | Status: AC | PRN
  Administered 2023-06-10: 100 mL via INTRAVENOUS

## 2023-06-10 MED ORDER — METHOCARBAMOL 1000 MG/10ML IJ SOLN
500.0000 mg | Freq: Three times a day (TID) | INTRAVENOUS | Status: DC
  Filled 2023-06-10: qty 5

## 2023-06-10 MED ORDER — POLYETHYLENE GLYCOL 3350 17 G PO PACK: 17.0000 g | PACK | Freq: Every day | ORAL | Status: DC | PRN

## 2023-06-10 MED ORDER — ONDANSETRON HCL 4 MG/2ML IJ SOLN: 4.0000 mg | Freq: Four times a day (QID) | INTRAMUSCULAR | Status: DC | PRN

## 2023-06-10 MED ORDER — SODIUM CHLORIDE 0.9 % IV SOLN: INTRAVENOUS | Status: DC

## 2023-06-10 MED ORDER — DOCUSATE SODIUM 100 MG PO CAPS
100.0000 mg | ORAL_CAPSULE | Freq: Two times a day (BID) | ORAL | Status: DC
  Administered 2023-06-10: 100 mg via ORAL
  Filled 2023-06-10: qty 1

## 2023-06-10 MED ORDER — ACETAMINOPHEN 500 MG PO TABS
1000.0000 mg | ORAL_TABLET | Freq: Four times a day (QID) | ORAL | Status: DC
  Administered 2023-06-10 – 2023-06-11 (×2): 1000 mg via ORAL
  Filled 2023-06-10 (×3): qty 2

## 2023-06-10 MED ORDER — LIDOCAINE 5 % EX PTCH
1.0000 | MEDICATED_PATCH | CUTANEOUS | Status: DC
  Administered 2023-06-10: 1 via TRANSDERMAL
  Filled 2023-06-10: qty 1

## 2023-06-10 NOTE — ED Notes (Signed)
Report called to [employer].
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
## 2023-06-10 NOTE — ED Notes (Signed)
EKG to EDP Paduchowski in person.  

## 2023-06-10 NOTE — ED Notes (Signed)
Called [facility] spoke w/George for possible transfer.  
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
## 2023-06-10 NOTE — ED Notes (Signed)
Patient took her night meds from her stock, bottles visualized to verify Wellbutrin- 300mg   Singulair- 10mg 
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
## 2023-06-10 NOTE — Progress Notes (Signed)
*  PRELIMINARY RESULTS* Echocardiogram 2D Echocardiogram has been performed.  Hailey Wall 06/10/2023, 5:27 PM

## 2023-06-11 ENCOUNTER — Observation Stay (HOSPITAL_COMMUNITY): Payer: PPO

## 2023-06-11 ENCOUNTER — Other Ambulatory Visit (HOSPITAL_COMMUNITY): Payer: Self-pay

## 2023-06-11 DIAGNOSIS — S2243XA Multiple fractures of ribs, bilateral, initial encounter for closed fracture: Secondary | ICD-10-CM | POA: Diagnosis not present

## 2023-06-11 LAB — BASIC METABOLIC PANEL
Anion gap: 7 (ref 5–15)
BUN: 13 mg/dL (ref 8–23)
CO2: 24 mmol/L (ref 22–32)
Calcium: 8.4 mg/dL — ABNORMAL LOW (ref 8.9–10.3)
Chloride: 106 mmol/L (ref 98–111)
Creatinine, Ser: 0.85 mg/dL (ref 0.44–1.00)
GFR, Estimated: >60 mL/min (ref >60)
Glucose, Bld: 114 mg/dL — ABNORMAL HIGH (ref 70–99)
Potassium: 4 mmol/L (ref 3.5–5.1)
Sodium: 137 mmol/L (ref 135–145)

## 2023-06-11 LAB — CBC
HCT: 36.7 % (ref 36.0–46.0)
Hemoglobin: 11.8 g/dL — ABNORMAL LOW (ref 12.0–15.0)
MCH: 31.3 pg (ref 26.0–34.0)
MCHC: 32.2 g/dL (ref 30.0–36.0)
MCV: 97.3 fL (ref 80.0–100.0)
Platelets: 164 10*3/uL (ref 150–400)
RBC: 3.77 MIL/uL — ABNORMAL LOW (ref 3.87–5.11)
RDW: 12.7 % (ref 11.5–15.5)
WBC: 7.4 10*3/uL (ref 4.0–10.5)
nRBC: 0 % (ref 0.0–0.2)

## 2023-06-11 MED ORDER — METHOCARBAMOL 500 MG PO TABS: 500.0000 mg | ORAL_TABLET | Freq: Four times a day (QID) | ORAL | 0 refills | Status: DC | PRN

## 2023-06-11 MED ORDER — TRAMADOL HCL 50 MG PO TABS: 50.0000 mg | ORAL_TABLET | Freq: Four times a day (QID) | ORAL | Status: DC | PRN

## 2023-06-11 MED ORDER — ACETAMINOPHEN 500 MG PO TABS: 1000.0000 mg | ORAL_TABLET | Freq: Four times a day (QID) | ORAL | 0 refills | Status: DC

## 2023-06-11 MED ORDER — TRAMADOL HCL 50 MG PO TABS: 50.0000 mg | ORAL_TABLET | Freq: Four times a day (QID) | ORAL | 0 refills | Status: DC | PRN

## 2023-06-11 MED ORDER — ACETAMINOPHEN 500 MG PO TABS
1000.0000 mg | ORAL_TABLET | Freq: Four times a day (QID) | ORAL | 0 refills | Status: AC
  Filled 2023-06-11: qty 100, 13d supply, fill #0

## 2023-06-11 MED ORDER — TRAMADOL HCL 50 MG PO TABS
50.0000 mg | ORAL_TABLET | Freq: Four times a day (QID) | ORAL | 0 refills | Status: AC | PRN
  Filled 2023-06-11: qty 30, 4d supply, fill #0

## 2023-06-11 MED ORDER — IBUPROFEN 800 MG PO TABS
800.0000 mg | ORAL_TABLET | Freq: Three times a day (TID) | ORAL | 0 refills | Status: AC | PRN
  Filled 2023-06-11: qty 30, 10d supply, fill #0

## 2023-06-11 MED ORDER — METHOCARBAMOL 500 MG PO TABS
500.0000 mg | ORAL_TABLET | Freq: Four times a day (QID) | ORAL | 0 refills | Status: DC | PRN
  Filled 2023-06-11: qty 30, 8d supply, fill #0

## 2023-06-11 MED ORDER — IBUPROFEN 800 MG PO TABS: 800.0000 mg | ORAL_TABLET | Freq: Three times a day (TID) | ORAL | 0 refills | Status: DC | PRN

## 2023-06-11 MED ORDER — IBUPROFEN 800 MG PO TABS: 800.0000 mg | ORAL_TABLET | Freq: Three times a day (TID) | ORAL | Status: DC | PRN

## 2023-06-11 NOTE — Progress Notes (Signed)
PT Cancellation Note  Patient Details Name: Hailey Wall MRN: [id number] DOB: 12-10-1946   Cancelled Treatment:    Reason Eval/Treat Not Completed: PT screened, no needs identified, will sign off  Pt reports she has been ambulatory without overt issues; nurse reports night shift observed functional ability. Currently upright in chair speaking with PA, Zeb Comfort, in no apparent distress. Will omit formal evaluation at this time.  Please reach out if there are any further concerns or a change in her functional status.  Thank you.  Kathlyn Sacramento, PT, DPT [facility]  Rehabilitation Services Physical Therapist Office: [phone] Website: [email or web address]   Hailey Wall 06/11/2023, 8:51 AM

## 2023-06-11 NOTE — Discharge Summary (Signed)
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 database.    Physical Exam: General:  Alert, NAD, pleasant, comfortable Abd:  Soft, ND, NT CV: no chest wall hematoma, appropraite chest wall tenderness, CTAB, pulling 1500 mL on incentive spirometry Ext: symmetrical, nontender extremities. MAE equally, sensation in tact  Allergies as of 06/11/2023       Reactions   Cat Hair Extract    Dust Mite Extract    Mold Extract [trichophyton] Other (See Comments)   Sinus trouble   Other Other (See Comments)   Dust mite, dust, cockroach eggs   Tree Extract         Medication List     TAKE these medications    acetaminophen 500 MG tablet Commonly known as: TYLENOL Take 2 tablets (1,000 mg total) by mouth every 6 (six) hours.   alendronate 70 MG tablet Commonly known as: FOSAMAX Take 1 tablet (70 mg total) by mouth every 7 (seven) days. Take with a full glass of water on an empty stomach. What changed: additional instructions   buPROPion 150 MG 24 hr tablet Commonly known as: WELLBUTRIN XL Take 1 tablet (150 mg total) by mouth every morning. What changed:  how much to take when to take this   CALCIUM + D PO Take 1 tablet by mouth daily.   ciprofloxacin-dexamethasone OTIC suspension Commonly known as: CIPRODEX Place 4 drops into both ears as needed (ear drum pain).    Diclofenac Sodium CR 100 MG 24 hr tablet Take 100 mg by mouth daily as needed  for pain.   EPINEPHrine 0.3 mg/0.3 mL Soaj injection Commonly known as: EPI-PEN Inject 0.3 mg into the muscle as needed for anaphylaxis.   Estriol 10 % Crea Apply 1 mg/ml (0.1%) cream per vagina 3 times weekly What changed:  how much to take how to take this when to take this   ezetimibe 10 MG tablet Commonly known as: ZETIA Take 10 mg by mouth every evening.   Flovent HFA 110 MCG/ACT inhaler Generic drug: fluticasone Inhale 1 puff into the lungs as needed (sinus flare).   halobetasol 0.05 % ointment Commonly known as: ULTRAVATE Apply to bites QD-BID up to two weeks. Avoid applying to face, groin, and axilla. Use as directed. Long-term use can cause thinning of the skin.   hydrocortisone 2.5 % cream Apply topically 2 (two) times daily as needed (Rash). As needed for rash and itch at face and under the breast. Long-term use can cause thinning of the skin.   ibuprofen 800 MG tablet Commonly known as: ADVIL Take 1 tablet (800 mg total) by mouth every 8 (eight) hours as needed for fever or headache. What changed:  medication strength how much to take when to take this reasons to take this   ketoconazole 2 % cream Commonly known as: NIZORAL Apply under the breast BID up to two weeks.   levocetirizine 5 MG tablet Commonly known as: XYZAL Take 5 mg by mouth every morning.   methocarbamol 500 MG tablet Commonly known as: ROBAXIN Take 1 tablet (500 mg total) by mouth every 6 (six) hours as needed for muscle spasms.   montelukast 10 MG tablet Commonly known as: SINGULAIR Take 10 mg by mouth at bedtime.   multivitamin tablet Take 1 tablet by mouth daily.   mupirocin ointment 2 % Commonly known as: BACTROBAN Apply 1 Application topically daily. What changed:  when to take this reasons to take this   niacinamide 500 MG tablet Take 500 mg by mouth 2 (two) times daily with a meal.    Progesterone Compounding Kit 20 % Crea Progesterone/Testosterone versabase 40-2 mg/mL cream Apply inner arm 2 clicks daily What changed:  how much to take how to take this when to take this additional instructions   rosuvastatin 10 MG tablet Commonly known as: CRESTOR Take 10 mg by mouth every other day.   traMADol 50 MG tablet Commonly known as: ULTRAM Take 1-2 tablets (50-100 mg total) by mouth every 6 (six) hours as needed for moderate pain or severe pain. What changed:  how much to take when to take this reasons to take this   triamcinolone 55 MCG/ACT Aero nasal inhaler Commonly known as: NASACORT Place 1 spray into the nose as needed (sinus flare).   triamcinolone cream 0.1 % Commonly known as: KENALOG Apply topically to itch areas of ears twice daily as needed. Can use for up to 2 weeks. Avoid applying to face, groin, and axilla. Use as directed.          Follow-up Information     Armando Gang, FNP. Go on 06/22/2023.   Specialty: Family Medicine Why: at 10:45 AM for hospital follow up. please let your PCP know about the incidental finding of pericardial effusion with normal echocardiogram. Contact information: [address] [place] [address] [phone]                 Signed: Hosie Spangle, [facility] 06/11/2023, 9:22 AM

## 2023-06-11 NOTE — ED Notes (Signed)
Pt walking around nurses station with stable gait and denies any pain.

## 2023-06-11 NOTE — ED Notes (Signed)
This RN reviewed discharge instructions with patient. She verbalized understanding and denied any further questions. PT well appearing upon discharge and reports tolerable pain. Pt ambulated with stable gait to exit. Pt endorses ride home.  

## 2023-06-11 NOTE — ED Notes (Signed)
Pt at bedside

## 2024-05-31 ENCOUNTER — Ambulatory Visit: Payer: PPO | Admitting: Dermatology

## 2024-05-31 DIAGNOSIS — Z1283 Encounter for screening for malignant neoplasm of skin: Secondary | ICD-10-CM | POA: Diagnosis not present

## 2024-05-31 DIAGNOSIS — Z87828 Personal history of other (healed) physical injury and trauma: Secondary | ICD-10-CM

## 2024-05-31 DIAGNOSIS — Z85828 Personal history of other malignant neoplasm of skin: Secondary | ICD-10-CM

## 2024-05-31 DIAGNOSIS — L821 Other seborrheic keratosis: Secondary | ICD-10-CM

## 2024-05-31 DIAGNOSIS — W908XXA Exposure to other nonionizing radiation, initial encounter: Secondary | ICD-10-CM | POA: Diagnosis not present

## 2024-05-31 DIAGNOSIS — D229 Melanocytic nevi, unspecified: Secondary | ICD-10-CM

## 2024-05-31 DIAGNOSIS — L219 Seborrheic dermatitis, unspecified: Secondary | ICD-10-CM

## 2024-05-31 DIAGNOSIS — L304 Erythema intertrigo: Secondary | ICD-10-CM

## 2024-05-31 DIAGNOSIS — L814 Other melanin hyperpigmentation: Secondary | ICD-10-CM | POA: Diagnosis not present

## 2024-05-31 DIAGNOSIS — L578 Other skin changes due to chronic exposure to nonionizing radiation: Secondary | ICD-10-CM | POA: Diagnosis not present

## 2024-05-31 DIAGNOSIS — D1801 Hemangioma of skin and subcutaneous tissue: Secondary | ICD-10-CM

## 2024-05-31 DIAGNOSIS — L738 Other specified follicular disorders: Secondary | ICD-10-CM

## 2024-05-31 DIAGNOSIS — W57XXXD Bitten or stung by nonvenomous insect and other nonvenomous arthropods, subsequent encounter: Secondary | ICD-10-CM

## 2024-05-31 MED ORDER — KETOCONAZOLE 2 % EX CREA: TOPICAL_CREAM | CUTANEOUS | 1 refills | Status: AC

## 2024-05-31 MED ORDER — TRIAMCINOLONE ACETONIDE 0.1 % EX CREA
TOPICAL_CREAM | CUTANEOUS | 1 refills | Status: AC
Start: 2024-05-31 — End: ?

## 2024-05-31 MED ORDER — HALOBETASOL PROPIONATE 0.05 % EX OINT: TOPICAL_OINTMENT | CUTANEOUS | 1 refills | Status: AC

## 2024-05-31 MED ORDER — MUPIROCIN 2 % EX OINT: TOPICAL_OINTMENT | CUTANEOUS | 1 refills | Status: AC

## 2024-05-31 NOTE — Patient Instructions (Addendum)

## 2024-05-31 NOTE — Progress Notes (Signed)
 Follow-Up Visit   Subjective  Hailey Wall is a 78 y.o. female who presents for the following: Skin Cancer Screening and Full Body Skin Exam. Patient with hx of BCC and SCC.   The patient presents for Total-Body Skin Exam (TBSE) for skin cancer screening and mole check. The patient has spots, moles and lesions to be evaluated, some may be new or changing and the patient may have concern these could be cancer.   The following portions of the chart were reviewed this encounter and updated as appropriate: medications, allergies, medical history  Review of Systems:  No other skin or systemic complaints except as noted in HPI or Assessment and Plan.  Objective  Well appearing patient in no apparent distress; mood and affect are within normal limits.  A full examination was performed including scalp, head, eyes, ears, nose, lips, neck, chest, axillae, abdomen, back, buttocks, bilateral upper extremities, bilateral lower extremities, hands, feet, fingers, toes, fingernails, and toenails. All findings within normal limits unless otherwise noted below.   Relevant physical exam findings are noted in the Assessment and Plan.  arms, legs Clear today  Assessment & Plan   SKIN CANCER SCREENING PERFORMED TODAY.  ACTINIC DAMAGE - Chronic condition, secondary to cumulative UV/sun exposure - diffuse scaly erythematous macules with underlying dyspigmentation - Recommend daily broad spectrum sunscreen SPF 30+ to sun-exposed areas, reapply every 2 hours as needed.  - Staying in the shade or wearing long sleeves, sun glasses (UVA+UVB protection) and wide brim hats (4-inch brim around the entire circumference of the hat) are also recommended for sun protection.  - Call for new or changing lesions.  LENTIGINES, SEBORRHEIC KERATOSES, HEMANGIOMAS - Benign normal skin lesions - SK- 6mm waxy brown macule at right upper calf  - Benign-appearing - Call for any changes  Sebaceous Hyperplasia - Small  yellow papules with a central dell - Benign-appearing - Observe. Call for changes  MELANOCYTIC NEVI - Tan-brown and/or pink-flesh-colored symmetric macules and papules - Benign appearing on exam today - Observation - Call clinic for new or changing moles - Recommend daily use of broad spectrum spf 30+ sunscreen to sun-exposed areas.   HISTORY OF BASAL CELL CARCINOMA OF THE SKIN Right lateral brow- 08/16/2020 - No evidence of recurrence today - Recommend regular full body skin exams - Recommend daily broad spectrum sunscreen SPF 30+ to sun-exposed areas, reapply every 2 hours as needed.  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  - Call if any new or changing lesions are noted between office visits  INTERTRIGO Exam Clear today inframammary area   Chronic condition with duration or expected duration over one year. Currently well-controlled.    Intertrigo is a chronic recurrent rash that occurs in skin fold areas that may be associated with friction; heat; moisture; yeast; fungus; and bacteria.  It is exacerbated by increased movement / activity; sweating; and higher atmospheric temperature.   Treatment Plan Continue ketoconazole  cream twice a day as needed for rash.    Topical steroids (such as triamcinolone , fluocinolone, fluocinonide, mometasone, clobetasol, halobetasol , betamethasone, hydrocortisone ) can cause thinning and lightening of the skin if they are used for too long in the same area. Your physician has selected the right strength  medicine for your problem and area affected on the body. Please use your medication only as directed by your physician to prevent side effects.   SEBORRHEIC  DERMATITIS Exam: Ears clear on exam today, pt states will itch off and on.  Chronic condition with duration or expected duration over one year. Currently well-controlled.   Seborrheic Dermatitis is a chronic persistent rash characterized by pinkness and scaling most commonly of the mid face but also can occur on the scalp (dandruff), ears; mid chest, mid back and groin.  It tends to be exacerbated by stress and cooler weather.  People who have neurologic disease may experience new onset or exacerbation of existing seborrheic dermatitis.  The condition is not curable but treatable and can be controlled.  Treatment Plan: Continue TMC cream apply bid prn aa ears for itch. Can use for up to 2 weeks at time. Avoid applying to face, groin, and axilla. Use as directed.    Topical steroids (such as triamcinolone , fluocinolone, fluocinonide, mometasone, clobetasol, halobetasol , betamethasone, hydrocortisone ) can cause thinning and lightening of the skin if they are used for too long in the same area. Your physician has selected the right strength medicine for your problem and area affected on the body. Please use your medication only as directed by your physician to prevent side effects.   .   BUG BITE, SUBSEQUENT ENCOUNTER arms, legs Patient has history of getting multiple insect bites during the summer. Needs rfs of current topicals she uses to treat.  Continue mupirocin  ointment bid prn for bug bites with open sores.  Continue using Halobetasol  ointment to bug bite BID up to two weeks for itching. Avoid f/g/a  Topical steroids (such as triamcinolone , fluocinolone, fluocinonide, mometasone, clobetasol, halobetasol , betamethasone, hydrocortisone ) can cause thinning and lightening of the skin if they are used for too long in the same area. Your physician has selected the right strength medicine for your problem and area affected on the body. Please use your medication only as directed by your physician to  prevent side effects.   Discussed use of DEET containing insect repellant, but pt will not use DEET due to safety concerns.  Advised her to wear protective clothing when outside.     SEBORRHEIC DERMATITIS   Related Medications triamcinolone  cream (KENALOG ) 0.1 % Apply topically to itch areas of ears twice daily as needed. Can use for up to 2 weeks. Avoid applying to face, groin, and axilla. Use as directed. Return in about 1 year (around 05/31/2025) for HxBCC, HxSCC, TBSE.  I, Jacquelynn V. Grier Leber, CMA, am acting as scribe for Artemio Larry, MD .   Documentation: I have reviewed the above documentation for accuracy and completeness, and I agree with the above.  Artemio Larry, MD

## 2024-07-28 ENCOUNTER — Other Ambulatory Visit: Payer: Self-pay | Admitting: Family Medicine

## 2024-07-28 DIAGNOSIS — Z1231 Encounter for screening mammogram for malignant neoplasm of breast: Secondary | ICD-10-CM

## 2024-08-15 ENCOUNTER — Ambulatory Visit
Admission: RE | Admit: 2024-08-15 | Discharge: 2024-08-15 | Disposition: A | Discharge status: Home / Self Care | Source: Ambulatory Visit | Attending: Family Medicine | Admitting: Family Medicine

## 2024-08-15 DIAGNOSIS — Z1231 Encounter for screening mammogram for malignant neoplasm of breast: Secondary | ICD-10-CM | POA: Insufficient documentation

## 2024-09-15 ENCOUNTER — Ambulatory Visit

## 2024-10-06 ENCOUNTER — Ambulatory Visit

## 2024-10-06 DIAGNOSIS — D225 Melanocytic nevi of trunk: Secondary | ICD-10-CM

## 2024-10-06 DIAGNOSIS — L853 Xerosis cutis: Secondary | ICD-10-CM

## 2024-10-06 DIAGNOSIS — L720 Epidermal cyst: Secondary | ICD-10-CM

## 2024-10-06 DIAGNOSIS — L820 Inflamed seborrheic keratosis: Secondary | ICD-10-CM | POA: Diagnosis not present

## 2024-10-06 DIAGNOSIS — D229 Melanocytic nevi, unspecified: Secondary | ICD-10-CM

## 2024-10-06 DIAGNOSIS — L821 Other seborrheic keratosis: Secondary | ICD-10-CM | POA: Diagnosis not present

## 2024-10-06 NOTE — Progress Notes (Signed)
    Subjective   Hailey Wall is a 78 y.o. female who presents for the following: Lesion(s) of concern . Patient is established patient   Today patient reports: Right index finger was dry used aquaphor and resolved. Other lesions at right wrist, dark spot at left upper arm.    Review of Systems:    No other skin or systemic complaints except as noted in HPI or Assessment and Plan.  The following portions of the chart were reviewed this encounter and updated as appropriate: medications, allergies, medical history  Relevant Medical History:  Personal history of non melanoma skin cancer - see medical history for full details   Objective  Well appearing patient in no apparent distress; mood and affect are within normal limits. Examination was performed of the: Focused Exam of: Bilateral arms, hands, face   Examination notable for: Seborrheic Keratosis(es): Stuck-on appearing keratotic papule(s) on the trunk, some  irritated with redness, crusting, edema, and/or partial avulsion  Nevi - well demarcated brown macules  - Multiple 2-3 mm white papules located on the face Examination limited by: Undergarments, Shoes or socks , Clothing, and Patient deferred removal     R wrist x1, L upper arm x1 (2) Stuck on waxy paps with erythema  Assessment & Plan    Benign Lesions/ Findings:  -Nevus -Seborrheic Keratosis - Reassurance provided regarding the benign appearance of lesions noted on exam today; no treatment is indicated in the absence of symptoms/changes. - Reinforced importance of photoprotective strategies including liberal and frequent sunscreen use of a broad-spectrum SPF 30 or greater, use of protective clothing, and sun avoidance for prevention of cutaneous malignancy and photoaging.  Counseled patient on the importance of regular self-skin monitoring as well as routine clinical skin examinations as scheduled.   Milia  - Discussed the diagnosis and reassured the pt regarding the  benign nature of this condition - Discussed extraction   Xerosis cutis - hands  - Discussed diagnosis, typical course, and treatment options for this condition - Advised can continue aquaphor prn    Level of service outlined above   Procedures, orders, diagnosis for this visit:  INFLAMED SEBORRHEIC KERATOSIS (2) R wrist x1, L upper arm x1 (2) Symptomatic, irritating, patient would like treated. Destruction of lesion - R wrist x1, L upper arm x1 (2) Complexity: simple   Destruction method: cryotherapy   Informed consent: discussed and consent obtained   Timeout:  patient name, date of birth, surgical site, and procedure verified Lesion destroyed using liquid nitrogen: Yes   Region frozen until ice ball extended beyond lesion: Yes   Cryo cycles: 1 or 2. Outcome: patient tolerated procedure well with no complications   Post-procedure details: wound care instructions given   Additional details:  Prior to procedure, discussed risks of blister formation, small wound, skin dyspigmentation, or rare scar following cryotherapy. Recommend Vaseline ointment to treated areas while healing.    Inflamed seborrheic keratosis -     Destruction of lesion    Return to clinic: Return for As scheduled, TBSE, w/ Dr. Jackquline.  Documentation:  I, Jacquelynn V. Wilfred, CMA, am acting as scribe for Lauraine JAYSON Kanaris, MD .  I have reviewed the above documentation for accuracy and completeness, and I agree with the above.  Lauraine JAYSON Kanaris, MD

## 2024-10-06 NOTE — Patient Instructions (Addendum)
 Cryosurgery  Cryosurgery ("freezing") uses liquid nitrogen to destroy certain types of skin lesions. Lowering the temperature of the lesion in a small area surrounding skin destroys the lesion. Immediately following cryosurgery, you will notice redness and swelling of the treatment area. Blistering or weeping may occur, lasting approximately one week which will then be followed by crusting. Most areas will heal completely in 10 to 14 days.  Wash the treated areas daily. Allow soap and water to run over the areas, but do not scrub. Should a scab or crust form, allow it to fall off on its own. Do not remove or pick at it. Application of an ointment  and a bandage may make you feel more comfortable, but it is not necessary. Some people develop an allergy to Neosporin, so we recommend that Vaseline or  Aquaphor be used.  The cryotherapy site will be more sensitive than your surrounding skin. Keep it covered, and remember to apply sunscreen every day to all your sun exposed skin. A scar may remain which is lighter or pinker than your normal skin. Your body will continue to improve your scar for up to one year; however a light-colored scar may remain.  Infection following cryotherapy is rare. However if you are worried about the appearance of the treated area, contact your doctor. We have a physician on call at all times. If you have any concerns about the site, please call our clinic at [phone]    Due to recent changes in healthcare laws, you may see results of your pathology and/or laboratory studies on MyChart before the doctors have had a chance to review them. We understand that in some cases there may be results that are confusing or concerning to you. Please understand that not all results are received at the same time and often the doctors may need to interpret multiple results in order to provide you with the best plan of care or course of treatment. Therefore, we ask that you please give us  2  business days to thoroughly review all your results before contacting the office for clarification. Should we see a critical lab result, you will be contacted sooner.   If You Need Anything After Your Visit  If you have any questions or concerns for your doctor, please call our main line at [phone] and press option 4 to reach your doctor's medical assistant. If no one answers, please leave a voicemail as directed and we will return your call as soon as possible. Messages left after 4 pm will be answered the following business day.   You may also send us  a message via MyChart. We typically respond to MyChart messages within 1-2 business days.  For prescription refills, please ask your pharmacy to contact our office. Our fax number is [phone].  If you have an urgent issue when the clinic is closed that cannot wait until the next business day, you can page your doctor at the number below.    Please note that while we do our best to be available for urgent issues outside of office hours, we are not available 24/7.   If you have an urgent issue and are unable to reach us , you may choose to seek medical care at your doctor's office, retail clinic, urgent care center, or emergency room.  If you have a medical emergency, please immediately call 911 or go to the emergency department.  Pager Numbers  - Dr. Hester: [phone]  - Dr. Jackquline: [phone]  - Dr. Claudene: [phone]  In the event of inclement weather, please call our main line at [phone] for an update on the status of any delays or closures.  Dermatology Medication Tips: Please keep the boxes that topical medications come in in order to help keep track of the instructions about where and how to use these. Pharmacies typically print the medication instructions only on the boxes and not directly on the medication tubes.   If your medication is too expensive, please contact our office at [phone] option 4 or  send us  a message through MyChart.   We are unable to tell what your co-pay for medications will be in advance as this is different depending on your insurance coverage. However, we may be able to find a substitute medication at lower cost or fill out paperwork to get insurance to cover a needed medication.   If a prior authorization is required to get your medication covered by your insurance company, please allow us  1-2 business days to complete this process.  Drug prices often vary depending on where the prescription is filled and some pharmacies may offer cheaper prices.  The website www.goodrx.com contains coupons for medications through different pharmacies. The prices here do not account for what the cost may be with help from insurance (it may be cheaper with your insurance), but the website can give you the price if you did not use any insurance.  - You can print the associated coupon and take it with your prescription to the pharmacy.  - You may also stop by our office during regular business hours and pick up a GoodRx coupon card.  - If you need your prescription sent electronically to a different pharmacy, notify our office through [facility] or by phone at [phone] option 4.     Si Usted Necesita Algo Despus de Su Visita  Tambin puede enviarnos un mensaje a travs de Clinical cytogeneticist. Por lo general respondemos a los mensajes de MyChart en el transcurso de 1 a 2 das hbiles.  Para renovar recetas, por favor pida a su farmacia que se ponga en contacto con nuestra oficina. Randi lakes de fax es [place] [phone].  Si tiene un asunto urgente cuando la clnica est cerrada y que no puede esperar hasta el siguiente da hbil, puede llamar/localizar a su doctor(a) al nmero que aparece a continuacin.   Por favor, tenga en cuenta que aunque hacemos todo lo posible para estar disponibles para asuntos urgentes fuera del horario de [place], no estamos disponibles las 24 horas del  da, los 7 [address]  [address] de la [place].   Si tiene un problema urgente y no puede comunicarse con nosotros, puede optar por buscar atencin mdica  en el consultorio de su doctor(a), en una clnica privada, en un centro de atencin urgente o en una sala de emergencias.  Si tiene Engineer, drilling, por favor llame inmediatamente al 911 o vaya a la sala de emergencias.  Nmeros de bper  - Dr. Hester: [phone]  - Dra. Jackquline: [phone]  - Dr. Claudene: [phone]   En caso de inclemencias del tiempo, por favor llame a landry capes principal al [phone] para una actualizacin sobre el [place] de cualquier retraso o cierre.  Consejos para la medicacin en dermatologa: Por favor, guarde las cajas en las que vienen los medicamentos de uso tpico para ayudarle a seguir las instrucciones sobre dnde y cmo usarlos. Las farmacias generalmente imprimen las instrucciones del medicamento slo en las cajas y no directamente en los tubos del [place].  Si su medicamento es muy caro, por favor, pngase en contacto con landry rieger llamando al [phone] y presione la opcin 4 o envenos un mensaje a travs de Clinical cytogeneticist.   No podemos decirle cul ser su copago por los medicamentos por adelantado ya que esto es diferente dependiendo de la cobertura de su seguro. Sin embargo, es posible que podamos encontrar un medicamento sustituto a Audiological scientist un formulario para que el seguro cubra el medicamento que se considera necesario.   Si se requiere una autorizacin previa para que su compaa de seguros malta su medicamento, por favor permtanos de 1 a 2 das hbiles para completar este proceso.  Los precios de los medicamentos varan con frecuencia dependiendo del Environmental consultant de dnde se surte la receta y alguna farmacias pueden ofrecer precios ms baratos.  El sitio web www.goodrx.com tiene cupones para medicamentos de Health and safety inspector. Los precios aqu no tienen en cuenta lo que podra  costar con la ayuda del seguro (puede ser ms barato con su seguro), pero el sitio web puede darle el precio si no utiliz Tourist information centre manager.  - Puede imprimir el cupn correspondiente y llevarlo con su receta a la farmacia.  - Tambin puede pasar por nuestra oficina durante el horario de atencin regular y Education officer, museum una tarjeta de cupones de GoodRx.  - Si necesita que su receta se enve electrnicamente a una farmacia diferente, informe a nuestra oficina a travs de MyChart de [place] o por telfono llamando al [phone] y presione la opcin 4.

## 2025-01-05 ENCOUNTER — Emergency Department

## 2025-01-05 ENCOUNTER — Other Ambulatory Visit: Payer: Self-pay

## 2025-01-05 ENCOUNTER — Encounter: Payer: Self-pay | Admitting: Intensive Care

## 2025-01-05 ENCOUNTER — Emergency Department
Admission: EM | Admit: 2025-01-05 | Discharge: 2025-01-05 | Disposition: A | Discharge status: Home / Self Care | Attending: Emergency Medicine | Admitting: Emergency Medicine

## 2025-01-05 DIAGNOSIS — W010XXA Fall on same level from slipping, tripping and stumbling without subsequent striking against object, initial encounter: Secondary | ICD-10-CM | POA: Insufficient documentation

## 2025-01-05 DIAGNOSIS — S4991XA Unspecified injury of right shoulder and upper arm, initial encounter: Secondary | ICD-10-CM | POA: Diagnosis present

## 2025-01-05 DIAGNOSIS — Y92008 Other place in unspecified non-institutional (private) residence as the place of occurrence of the external cause: Secondary | ICD-10-CM | POA: Insufficient documentation

## 2025-01-05 DIAGNOSIS — S42214A Unspecified nondisplaced fracture of surgical neck of right humerus, initial encounter for closed fracture: Secondary | ICD-10-CM | POA: Diagnosis not present

## 2025-01-05 DIAGNOSIS — I10 Essential (primary) hypertension: Secondary | ICD-10-CM | POA: Diagnosis not present

## 2025-01-05 HISTORY — DX: Unspecified osteoarthritis, unspecified site: M19.90

## 2025-01-05 MED ORDER — CYCLOBENZAPRINE HCL 5 MG PO TABS: 5.0000 mg | ORAL_TABLET | Freq: Three times a day (TID) | ORAL | 0 refills | Status: AC | PRN

## 2025-01-05 MED ORDER — HYDROCODONE-ACETAMINOPHEN 5-325 MG PO TABS: 1.0000 | ORAL_TABLET | Freq: Three times a day (TID) | ORAL | 0 refills | Status: AC | PRN

## 2025-01-05 MED ORDER — HYDROCODONE-ACETAMINOPHEN 5-325 MG PO TABS
1.0000 | ORAL_TABLET | Freq: Once | ORAL | Status: AC
  Administered 2025-01-05: 1 via ORAL
  Filled 2025-01-05: qty 1

## 2025-01-05 MED ORDER — ONDANSETRON 4 MG PO TBDP
4.0000 mg | ORAL_TABLET | Freq: Once | ORAL | Status: AC
  Administered 2025-01-05: 4 mg via ORAL
  Filled 2025-01-05: qty 1

## 2025-01-05 MED ORDER — CYCLOBENZAPRINE HCL 10 MG PO TABS
10.0000 mg | ORAL_TABLET | Freq: Once | ORAL | Status: AC
  Administered 2025-01-05: 10 mg via ORAL
  Filled 2025-01-05: qty 1

## 2025-01-05 NOTE — Discharge Instructions (Addendum)
 You are being treated with an arm sling and immobilizer for your stable shoulder fracture.  You should keep the arm in a sling and apply ice to help with swelling.  Take the prescription pain medicine as needed.  Follow-up with [facility] orthopedics as discussed.

## 2025-01-05 NOTE — ED Triage Notes (Signed)
 Patient reports slipping off porch onto right side. C/o right shoulder pain.   Denies hitting head. Denies LOC.

## 2025-01-05 NOTE — ED Triage Notes (Signed)
 Triage delayed due to patient being outside on phone

## 2025-01-05 NOTE — ED Provider Notes (Addendum)
 "   [facility] Emergency Department Provider Note     Event Date/Time   First MD Initiated Contact with Patient 01/05/25 1642     (approximate)   History   Arm Injury   HPI  Hailey Wall is a 79 y.o. right-handed female with a history of osteoporosis, GERD, HLD, HTN, anxiety, who presents to the ED following a slip and fall.  She fell on the porch landing on her right side patient presents to the ED with right shoulder pain and disability patient denies any head injury or LOC.     Physical Exam   Triage Vital Signs: ED Triage Vitals  Encounter Vitals Group     BP 01/05/25 1517 (!) 143/71     Girls Systolic BP Percentile --      Girls Diastolic BP Percentile --      Boys Systolic BP Percentile --      Boys Diastolic BP Percentile --      Pulse Rate 01/05/25 1517 96     Resp 01/05/25 1517 16     Temp 01/05/25 1517 98.7 F (37.1 C)     Temp Source 01/05/25 1517 Oral     SpO2 01/05/25 1517 95 %     Weight 01/05/25 1515 145 lb 3.2 oz (65.9 kg)     Height 01/05/25 1515 5' 6 (1.676 m)     Head Circumference --      Peak Flow --      Pain Score 01/05/25 1514 8     Pain Loc --      Pain Education --      Exclude from Growth Chart --     Most recent vital signs: Vitals:   01/05/25 1517  BP: (!) 143/71  Pulse: 96  Resp: 16  Temp: 98.7 F (37.1 C)  SpO2: 95%    General Awake, no distress. NAD HEENT NCAT. PERRL. EOMI. No rhinorrhea. Mucous membranes are moist.  CV:  Good peripheral perfusion. RRR.  No CCE distally RESP:  Normal effort. CTA ABD:  No distention.  MSK:  Right shoulder without obvious deformity or dislocation.  There is some proximal humerus fullness of about the musculature.  She is tender to palpation of the surgical neck.  Normal composite fist distally. NEURO: Cranial nerves II to XII grossly intact.  Normal gross sensation.  ED Results / Procedures / Treatments   Labs (all labs ordered are listed, but only  abnormal results are displayed) Labs Reviewed - No data to display   EKG   RADIOLOGY  I personally viewed and evaluated these images as part of my medical decision making, as well as reviewing the written report by the radiologist.  ED Provider Interpretation: Mildly displaced humeral neck fracture on the right  DG Shoulder Right Result Date: 01/05/2025 CLINICAL DATA:  Fall off porch on the right side. EXAM: RIGHT SHOULDER - 2+ VIEW COMPARISON:  None Available. FINDINGS: There is a mildly displaced right humeral neck fracture with slight comminution. No dislocation. Minimal degenerative change of the [facility] joint. IMPRESSION: Mildly displaced right humeral neck fracture. Electronically Signed   By: Toribio Agreste M.D.   On: 01/05/2025 16:18    PROCEDURES:  Critical Care performed: No  Procedures   MEDICATIONS ORDERED IN ED: Medications  ondansetron  (ZOFRAN -ODT) disintegrating tablet 4 mg (4 mg Oral Given 01/05/25 1810)  HYDROcodone -acetaminophen  (NORCO/VICODIN) 5-325 MG per tablet 1 tablet (1 tablet Oral Given 01/05/25 1809)     IMPRESSION / MDM /  ASSESSMENT AND PLAN / ED COURSE  I reviewed the triage vital signs and the nursing notes.                              Differential diagnosis includes, but is not limited to, fracture, dislocation, DJD  Patient's presentation is most consistent with acute, uncomplicated illness.  Clinical Course as of 01/05/25 1818  Thu Jan 05, 2025  1715 Spoke with Dr. Jackquline Barrack (Ortho): He will evaluate the patient in the office for outpatient fracture management. [JM]    Clinical Course User Index [JM] Tequilla Cousineau, Candida LULLA Kings, PA-C     Patient's diagnosis is consistent with close proximal humeral neck fracture on the right.  Geriatric patient presents to the ED following mechanical fall.  She endorses pain and disability of the right shoulder.  No head injury or LOC reported.  Fracture morphology is confirmed by my interpretation of plain  films of the right shoulder.  Patient will be placed in a shoulder sling immobilizer, and discharged with fracture care instructions.  Patient will be discharged home with prescriptions for Zofran  and hydrocodone  (#12). Patient is to follow up with [facility] as discussed, as needed or otherwise directed. Patient is given ED precautions to return to the ED for any worsening or new symptoms.    FINAL CLINICAL IMPRESSION(S) / ED DIAGNOSES   Final diagnoses:  Closed nondisplaced fracture of surgical neck of right humerus, unspecified fracture morphology, initial encounter     Rx / DC Orders   ED Discharge Orders          Ordered    HYDROcodone -acetaminophen  (NORCO/VICODIN) 5-325 MG tablet  3 times daily PRN        01/05/25 1720    cyclobenzaprine  (FLEXERIL ) 5 MG tablet  3 times daily PRN        01/05/25 1817             Note:  This document was prepared using Dragon voice recognition software and may include unintentional dictation errors.    Loyd Candida LULLA Kings, PA-C 01/05/25 1736    Loyd Candida LULLA Kings, PA-C 01/05/25 1818    Jacolyn Pae, MD 01/05/25 2110  "

## 2025-05-30 ENCOUNTER — Encounter: Admitting: Dermatology

## 2025-06-01 ENCOUNTER — Encounter
